# Patient Record
Sex: Female | Born: 1955 | Race: Black or African American | Hispanic: No | Marital: Married | Smoking: Current every day smoker
Health system: Southern US, Community
[De-identification: ages and names within clinical notes are randomized; demographics above are authoritative.]

## PROBLEM LIST (undated history)

## (undated) ENCOUNTER — Emergency Department (HOSPITAL_COMMUNITY): Payer: Medicare HMO | Source: Home / Self Care

## (undated) DIAGNOSIS — M1990 Unspecified osteoarthritis, unspecified site: Secondary | ICD-10-CM

## (undated) DIAGNOSIS — I255 Ischemic cardiomyopathy: Secondary | ICD-10-CM

## (undated) DIAGNOSIS — I10 Essential (primary) hypertension: Secondary | ICD-10-CM

## (undated) DIAGNOSIS — I2510 Atherosclerotic heart disease of native coronary artery without angina pectoris: Secondary | ICD-10-CM

## (undated) HISTORY — PX: CHOLECYSTECTOMY: SHX55

## (undated) HISTORY — PX: CARDIAC CATHETERIZATION: SHX172

## (undated) HISTORY — PX: KNEE SURGERY: SHX244

---

## 2005-06-10 ENCOUNTER — Ambulatory Visit (HOSPITAL_COMMUNITY): Admission: RE | Admit: 2005-06-10 | Discharge: 2005-06-10 | Payer: Self-pay | Admitting: Family Medicine

## 2005-06-17 ENCOUNTER — Ambulatory Visit: Payer: Self-pay | Admitting: Orthopedic Surgery

## 2006-05-28 ENCOUNTER — Emergency Department (HOSPITAL_COMMUNITY): Admission: EM | Admit: 2006-05-28 | Discharge: 2006-05-29 | Payer: Self-pay | Admitting: Emergency Medicine

## 2006-11-14 ENCOUNTER — Ambulatory Visit (HOSPITAL_COMMUNITY): Admission: RE | Admit: 2006-11-14 | Discharge: 2006-11-14 | Payer: Self-pay | Admitting: Family Medicine

## 2006-11-25 ENCOUNTER — Encounter: Admission: RE | Admit: 2006-11-25 | Discharge: 2006-11-25 | Payer: Self-pay | Admitting: Family Medicine

## 2007-07-10 ENCOUNTER — Encounter: Admission: RE | Admit: 2007-07-10 | Discharge: 2007-07-10 | Payer: Self-pay | Admitting: Family Medicine

## 2010-03-28 ENCOUNTER — Ambulatory Visit (HOSPITAL_COMMUNITY): Admission: RE | Admit: 2010-03-28 | Discharge: 2010-03-28 | Payer: Self-pay | Admitting: Family Medicine

## 2011-01-20 ENCOUNTER — Encounter: Payer: Self-pay | Admitting: Family Medicine

## 2011-01-21 ENCOUNTER — Inpatient Hospital Stay (HOSPITAL_COMMUNITY)
Admission: RE | Admit: 2011-01-21 | Discharge: 2011-01-24 | Payer: Self-pay | Source: Home / Self Care | Attending: Orthopedic Surgery | Admitting: Orthopedic Surgery

## 2011-01-21 LAB — DIFFERENTIAL
Basophils Absolute: 0 10*3/uL (ref 0.0–0.1)
Eosinophils Absolute: 0.1 10*3/uL (ref 0.0–0.7)
Eosinophils Relative: 2 % (ref 0–5)
Lymphs Abs: 3.3 10*3/uL (ref 0.7–4.0)
Neutrophils Relative %: 32 % — ABNORMAL LOW (ref 43–77)

## 2011-01-21 LAB — PROTIME-INR
INR: 0.92 (ref 0.00–1.49)
Prothrombin Time: 12.6 seconds (ref 11.6–15.2)

## 2011-01-21 LAB — URINALYSIS, ROUTINE W REFLEX MICROSCOPIC
Ketones, ur: NEGATIVE mg/dL
Nitrite: NEGATIVE
Protein, ur: NEGATIVE mg/dL
Urine Glucose, Fasting: NEGATIVE mg/dL
pH: 5 (ref 5.0–8.0)

## 2011-01-21 LAB — BASIC METABOLIC PANEL
BUN: 14 mg/dL (ref 6–23)
CO2: 28 mEq/L (ref 19–32)
Chloride: 104 mEq/L (ref 96–112)
Creatinine, Ser: 0.89 mg/dL (ref 0.4–1.2)
Potassium: 4.2 mEq/L (ref 3.5–5.1)

## 2011-01-21 LAB — CBC
MCV: 86.1 fL (ref 78.0–100.0)
Platelets: 246 10*3/uL (ref 150–400)
RBC: 4.54 MIL/uL (ref 3.87–5.11)
RDW: 14.3 % (ref 11.5–15.5)
WBC: 5.4 10*3/uL (ref 4.0–10.5)

## 2011-01-21 LAB — APTT: aPTT: 31 seconds (ref 24–37)

## 2011-01-21 LAB — SURGICAL PCR SCREEN: Staphylococcus aureus: NEGATIVE

## 2011-01-23 LAB — CBC
HCT: 28.6 % — ABNORMAL LOW (ref 36.0–46.0)
Hemoglobin: 10 g/dL — ABNORMAL LOW (ref 12.0–15.0)
Hemoglobin: 9.4 g/dL — ABNORMAL LOW (ref 12.0–15.0)
MCV: 84.8 fL (ref 78.0–100.0)
Platelets: 223 10*3/uL (ref 150–400)
RBC: 3.56 MIL/uL — ABNORMAL LOW (ref 3.87–5.11)
RBC: 3.34 MIL/uL — ABNORMAL LOW (ref 3.87–5.11)
WBC: 4.3 10*3/uL (ref 4.0–10.5)

## 2011-01-23 LAB — BASIC METABOLIC PANEL
BUN: 7 mg/dL (ref 6–23)
CO2: 27 mEq/L (ref 19–32)
Chloride: 101 mEq/L (ref 96–112)
GFR calc Af Amer: >60 mL/min (ref >60)
Potassium: 3.5 mEq/L (ref 3.5–5.1)

## 2011-01-23 LAB — PROTIME-INR
INR: 1.01 (ref 0.00–1.49)
INR: 1.3 (ref 0.00–1.49)
Prothrombin Time: 13.5 seconds (ref 11.6–15.2)
Prothrombin Time: 16.4 seconds — ABNORMAL HIGH (ref 11.6–15.2)

## 2011-01-24 LAB — TYPE AND SCREEN
ABO/RH(D): O POS
Unit division: 0

## 2011-01-24 LAB — CBC
HCT: 28.3 % — ABNORMAL LOW (ref 36.0–46.0)
Hemoglobin: 9.2 g/dL — ABNORMAL LOW (ref 12.0–15.0)
RBC: 3.31 MIL/uL — ABNORMAL LOW (ref 3.87–5.11)

## 2011-01-24 LAB — PROTIME-INR
INR: 1.41 (ref 0.00–1.49)
Prothrombin Time: 17.5 seconds — ABNORMAL HIGH (ref 11.6–15.2)

## 2011-02-15 NOTE — Discharge Summary (Signed)
[name]                 ACCOUNT NO.:  [id number]  MEDICAL RECORD NO.:  [id number]          PATIENT TYPE:  INP  LOCATION:  5015                         FACILITY:  [facility]  PHYSICIAN:  Feliberto Gottron. Turner Daniels, M.D.   DATE OF BIRTH:  [birth year]  DATE OF ADMISSION:  01/21/2011 DATE OF DISCHARGE:  01/24/2011                              DISCHARGE SUMMARY   CHIEF COMPLAINT:  Right knee pain.  HISTORY OF PRESENT ILLNESS:  This is a 55 year old lady who complains of severe unremitting pain in her right knee despite extensive conservative treatment.  She now desires a surgical intervention.  All risks and benefits of surgery were discussed with the patient.  Her past medical history is significant for hypertension.  PAST SURGICAL HISTORY:  Significant for lumpectomy and cholecystectomy.  She has no known drug allergies.  SOCIAL HISTORY:  She smokes half a pack of cigarettes per day and denies use of alcohol.  FAMILY HISTORY:  Positive for diabetes, hypertension, and coronary artery disease.  REVIEW OF SYSTEMS:  Positive for sickle cell trait.  PHYSICAL EXAMINATION:  Gross examination of the right knee demonstrates a significant valgus deformity.  Range of motion is 0 to 120 degrees. She is tender to palpation along the lateral joint line and is neurovascularly intact.  X-rays demonstrate bone-on-bone degenerative joint disease in the lateral compartment of the right knee.  PREOPERATIVE LABORATORY DATA:  White blood cell is 5.4, red blood cell is 4.54, hemoglobin 13, hematocrit 39.1, platelets 246.  PT 12.6, INR 0.94, PTT 31.  Sodium 140, potassium 4.2, chloride 104, glucose 87, BUN 14, creatinine 0.89.  Urinalysis was within normal limits.  HOSPITAL COURSE:  Ms. Rick was admitted to [facility] on January 21, 2011, when she underwent right total knee arthroplasty.  The procedure was performed by Dr. Gean Birchwood, and the patient tolerated it well. Two Hemovac drains were  placed into the right knee, a perioperative Foley catheter was also placed.  She was transferred to the floor on Lovenox and Coumadin for DVT prophylaxis.  On the first postoperative day, she was awake and alert.  She denied any nausea or vomiting, reported that her knee pain was well controlled.  Hemoglobin was 10. Her drain was removed without difficulty.  Surgical dressing remained clean.  On the second postoperative day, she complained of itching with her pain medicine that improved with Benadryl.  Her surgical dressing was changed and her incision was found to be benign.  Hemoglobin was 9.4.  She denied any dizziness or shortness of breath and was making great progress with physical therapy.  On the third postoperative day, she reported improvement in the itching, hemoglobin was 9.2.  Surgical dressing remained clean.  She met all of her physical therapy goals and was discharged home.  DISPOSITION:  The patient was discharged home on January 24, 2011.  She was weightbearing as tolerated.  Home health care would manage herwound, Coumadin, and physical therapy.  She will remain on Coumadin for 2 weeks with a target INR of 1.5-2.  Her followup in the clinic will be in 10 days for x-rays  and staple removal.  FINAL DIAGNOSIS:  End-stage degenerative joint disease of the right knee.     Shirl Harris, PA   ______________________________ Feliberto Gottron. Turner Daniels, M.D.    JW/MEDQ  D:  02/07/2011  T:  02/08/2011  Job:  [id number]  Electronically Signed by Shirl Harris PA on 02/14/2011 09:44:15 AM Electronically Signed by Gean Birchwood M.D. on 02/15/2011 12:25:26 AM

## 2012-02-11 IMAGING — CR DG CHEST 2V
2 series · 2 of 2 positions shown · non-contrast
Comparison: None.

CLINICAL DATA: Preop for surgery on the right knee

CHEST - 2 VIEW

[view not recorded (1 of 2)]
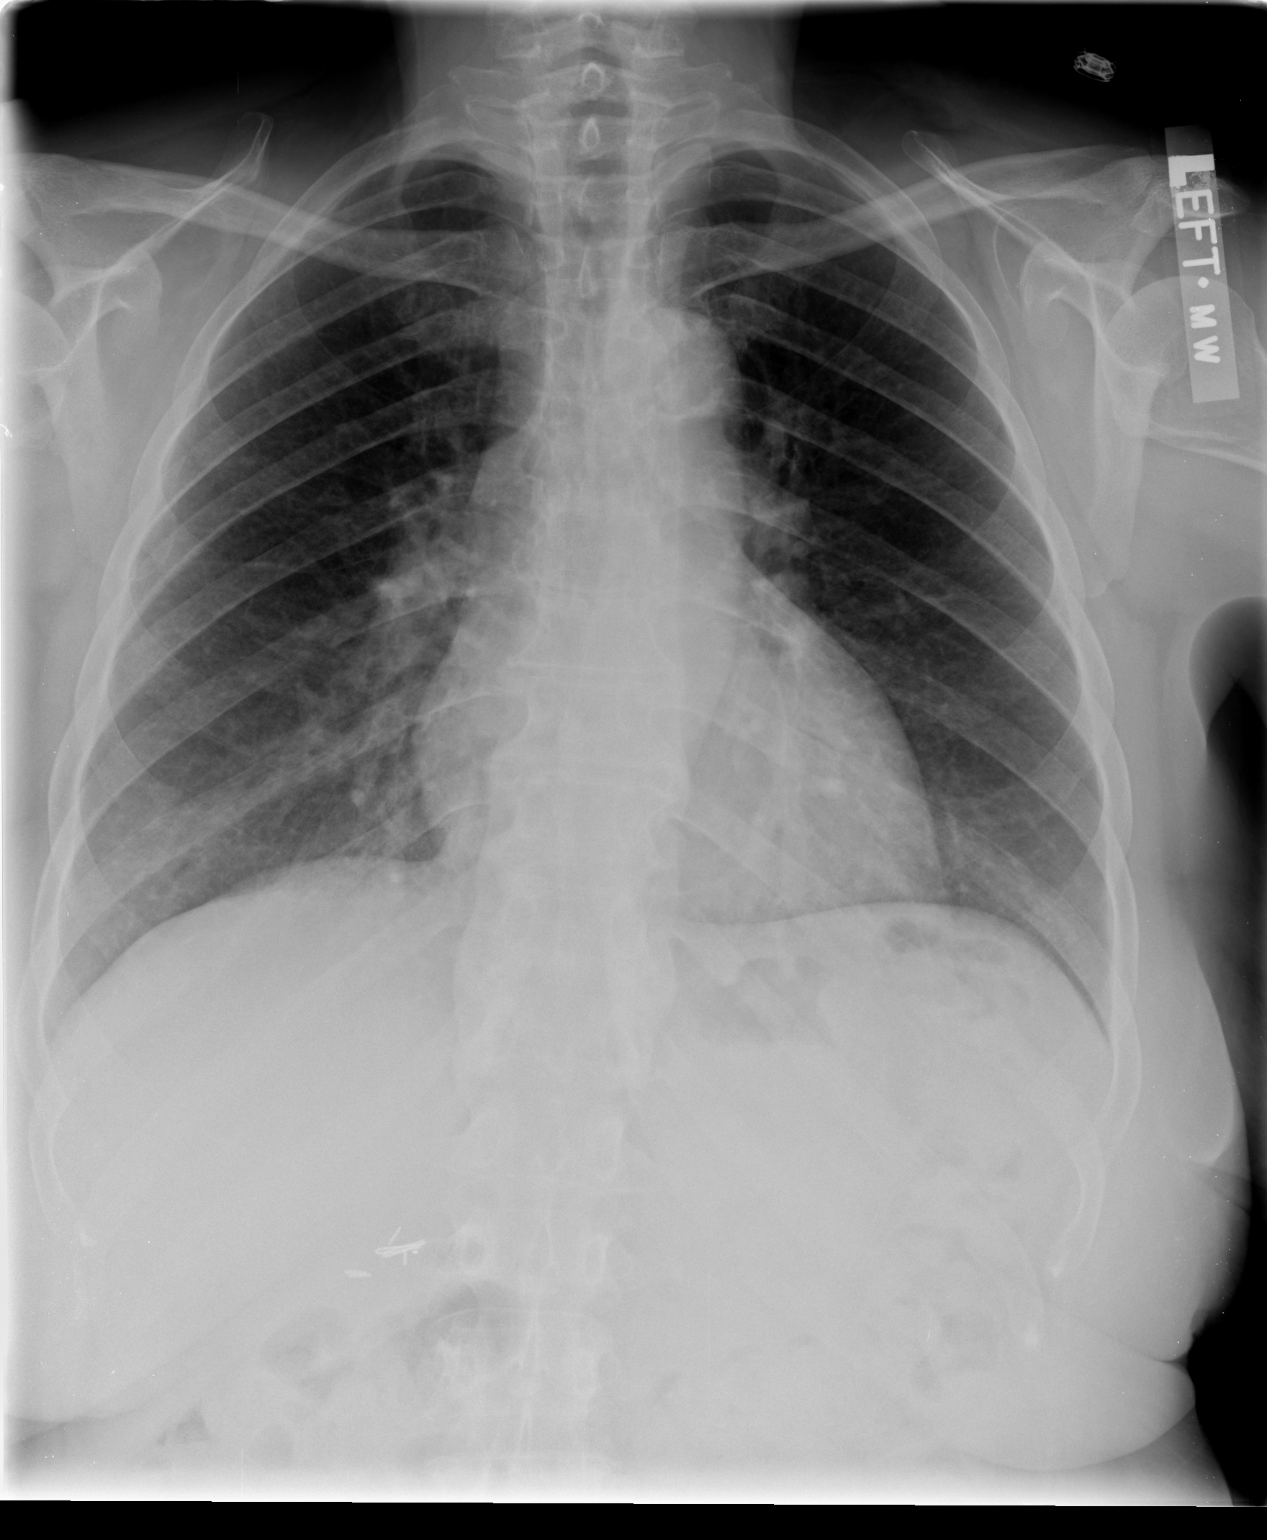

[view not recorded (2 of 2)]
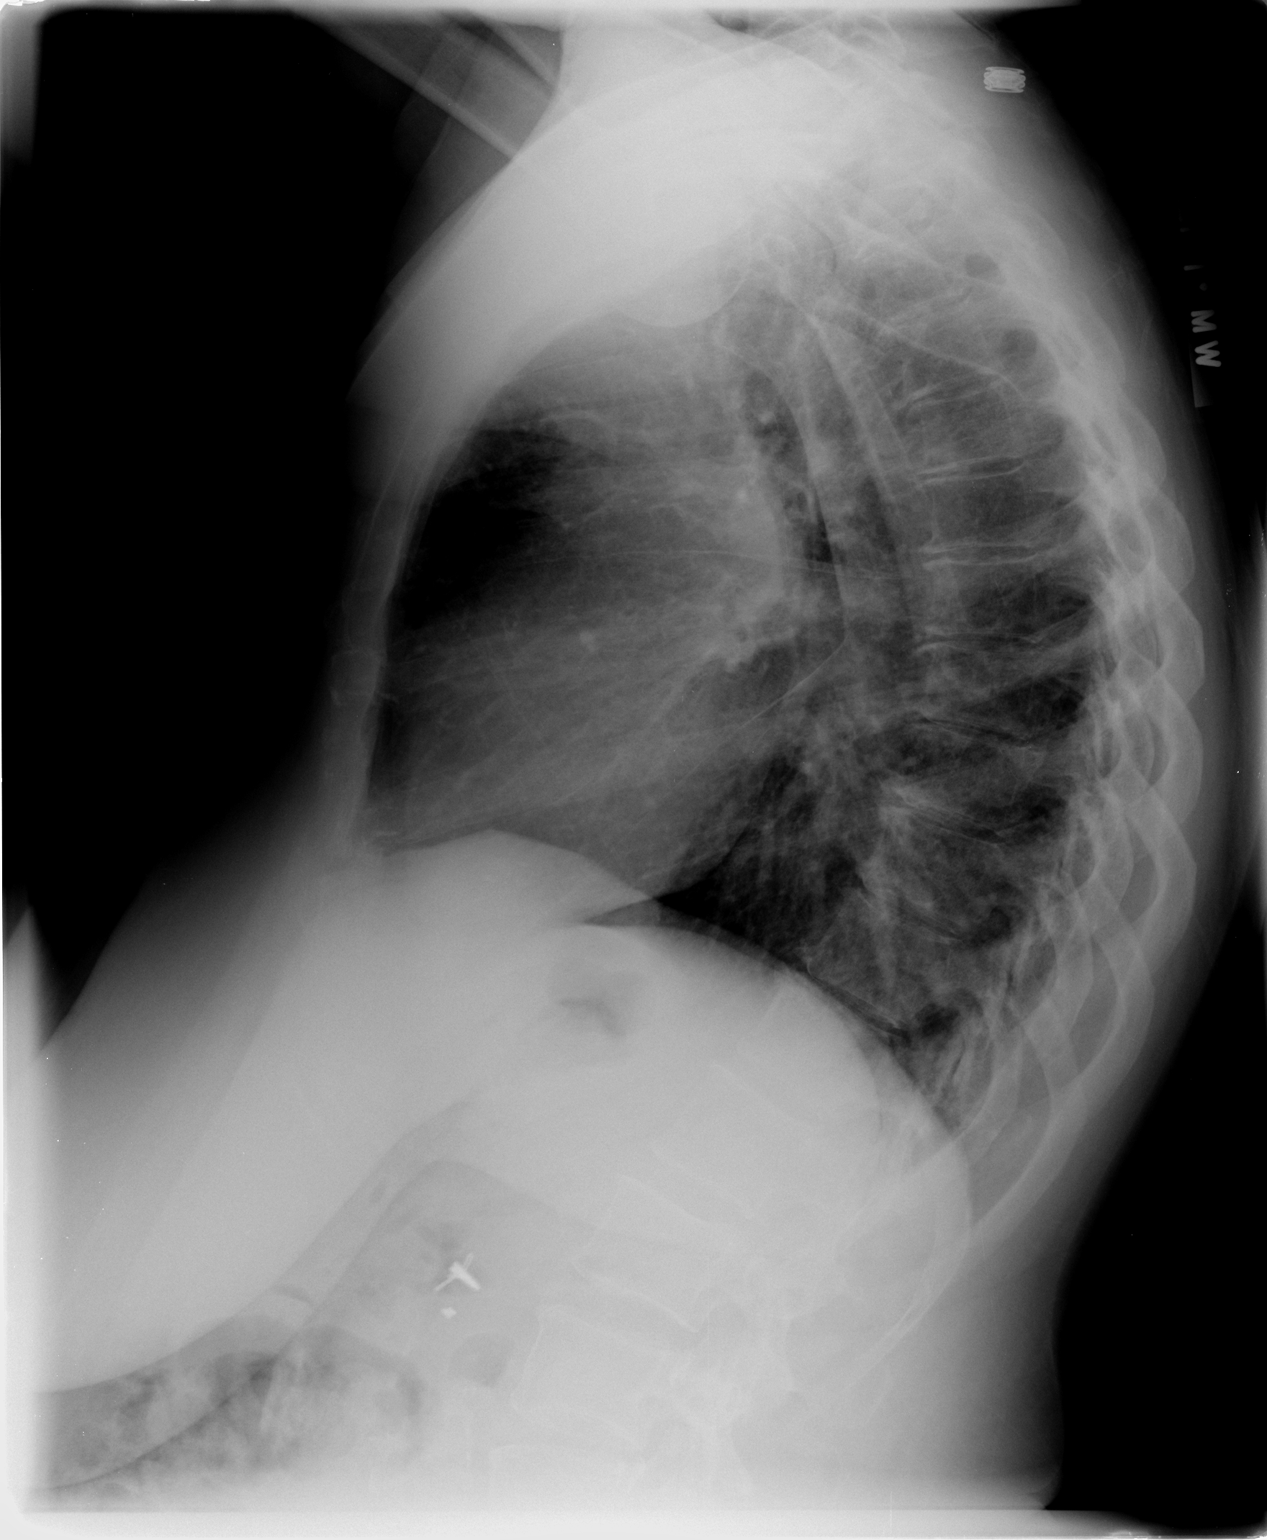

[2 of 2 positions shown; findings below may reference images not displayed]

FINDINGS: The lungs are clear.  Mediastinal contours appear normal.
The heart is within upper limits of normal.  There are degenerative
changes throughout the thoracic spine.  Surgical clips are noted in
the right upper quadrant from prior cholecystectomy.
IMPRESSION: No active lung disease.  Borderline cardiomegaly.

## 2012-06-29 DIAGNOSIS — I2510 Atherosclerotic heart disease of native coronary artery without angina pectoris: Secondary | ICD-10-CM

## 2012-06-29 DIAGNOSIS — I255 Ischemic cardiomyopathy: Secondary | ICD-10-CM

## 2012-06-29 HISTORY — DX: Ischemic cardiomyopathy: I25.5

## 2012-06-29 HISTORY — DX: Atherosclerotic heart disease of native coronary artery without angina pectoris: I25.10

## 2012-07-12 ENCOUNTER — Inpatient Hospital Stay (HOSPITAL_COMMUNITY)
Admission: EM | Admit: 2012-07-12 | Discharge: 2012-07-15 | DRG: 853 | Disposition: A | Discharge status: Home / Self Care | Payer: BC Managed Care – PPO | Attending: Cardiology | Admitting: Cardiology

## 2012-07-12 ENCOUNTER — Encounter (HOSPITAL_COMMUNITY): Payer: Self-pay | Admitting: Emergency Medicine

## 2012-07-12 ENCOUNTER — Emergency Department (HOSPITAL_COMMUNITY): Payer: BC Managed Care – PPO

## 2012-07-12 DIAGNOSIS — E669 Obesity, unspecified: Secondary | ICD-10-CM | POA: Diagnosis present

## 2012-07-12 DIAGNOSIS — Z955 Presence of coronary angioplasty implant and graft: Secondary | ICD-10-CM

## 2012-07-12 DIAGNOSIS — F17200 Nicotine dependence, unspecified, uncomplicated: Secondary | ICD-10-CM | POA: Diagnosis present

## 2012-07-12 DIAGNOSIS — R079 Chest pain, unspecified: Secondary | ICD-10-CM

## 2012-07-12 DIAGNOSIS — Z720 Tobacco use: Secondary | ICD-10-CM | POA: Diagnosis present

## 2012-07-12 DIAGNOSIS — Z8249 Family history of ischemic heart disease and other diseases of the circulatory system: Secondary | ICD-10-CM

## 2012-07-12 DIAGNOSIS — I10 Essential (primary) hypertension: Secondary | ICD-10-CM | POA: Diagnosis present

## 2012-07-12 DIAGNOSIS — I255 Ischemic cardiomyopathy: Secondary | ICD-10-CM | POA: Diagnosis present

## 2012-07-12 DIAGNOSIS — I2589 Other forms of chronic ischemic heart disease: Secondary | ICD-10-CM | POA: Diagnosis present

## 2012-07-12 DIAGNOSIS — Z96659 Presence of unspecified artificial knee joint: Secondary | ICD-10-CM

## 2012-07-12 DIAGNOSIS — I214 Non-ST elevation (NSTEMI) myocardial infarction: Principal | ICD-10-CM | POA: Diagnosis present

## 2012-07-12 HISTORY — DX: Essential (primary) hypertension: I10

## 2012-07-12 LAB — CBC
HCT: 41 % (ref 36.0–46.0)
Hemoglobin: 13.4 g/dL (ref 12.0–15.0)
MCHC: 32.7 g/dL (ref 30.0–36.0)
RBC: 4.72 MIL/uL (ref 3.87–5.11)
WBC: 4.2 10*3/uL (ref 4.0–10.5)

## 2012-07-12 LAB — COMPREHENSIVE METABOLIC PANEL
ALT: 15 U/L (ref 0–35)
Alkaline Phosphatase: 63 U/L (ref 39–117)
BUN: 13 mg/dL (ref 6–23)
CO2: 30 mEq/L (ref 19–32)
Chloride: 103 mEq/L (ref 96–112)
GFR calc Af Amer: 71 mL/min — ABNORMAL LOW (ref >90)
GFR calc non Af Amer: 62 mL/min — ABNORMAL LOW (ref >90)
Glucose, Bld: 116 mg/dL — ABNORMAL HIGH (ref 70–99)
Potassium: 3.4 mEq/L — ABNORMAL LOW (ref 3.5–5.1)
Total Bilirubin: 0.2 mg/dL — ABNORMAL LOW (ref 0.3–1.2)

## 2012-07-12 LAB — PROTIME-INR: Prothrombin Time: 12.5 seconds (ref 11.6–15.2)

## 2012-07-12 MED ORDER — ASPIRIN 81 MG PO CHEW
324.0000 mg | CHEWABLE_TABLET | Freq: Once | ORAL | Status: AC
  Administered 2012-07-12: 324 mg via ORAL
  Filled 2012-07-12: qty 4

## 2012-07-12 MED ORDER — SODIUM CHLORIDE 0.9 % IV SOLN
1000.0000 mL | INTRAVENOUS | Status: DC
  Administered 2012-07-12: 1000 mL via INTRAVENOUS

## 2012-07-12 MED ORDER — GI COCKTAIL ~~LOC~~
30.0000 mL | Freq: Once | ORAL | Status: AC
  Administered 2012-07-12: 30 mL via ORAL
  Filled 2012-07-12: qty 30

## 2012-07-12 MED ORDER — MORPHINE SULFATE 4 MG/ML IJ SOLN
4.0000 mg | Freq: Once | INTRAMUSCULAR | Status: AC
  Administered 2012-07-12: 4 mg via INTRAVENOUS
  Filled 2012-07-12: qty 1

## 2012-07-12 MED ORDER — NITROGLYCERIN 2 % TD OINT
1.0000 [in_us] | TOPICAL_OINTMENT | Freq: Four times a day (QID) | TRANSDERMAL | Status: DC
  Administered 2012-07-12: 1 [in_us] via TOPICAL
  Filled 2012-07-12: qty 1

## 2012-07-12 MED ORDER — HEPARIN BOLUS VIA INFUSION
4000.0000 [IU] | Freq: Once | INTRAVENOUS | Status: AC
  Administered 2012-07-12: 4000 [IU] via INTRAVENOUS

## 2012-07-12 MED ORDER — HEPARIN (PORCINE) IN NACL 100-0.45 UNIT/ML-% IJ SOLN
12.0000 [IU]/kg/h | INTRAMUSCULAR | Status: DC
  Administered 2012-07-12: 12 [IU]/kg/h via INTRAVENOUS
  Filled 2012-07-12 (×2): qty 250

## 2012-07-12 NOTE — ED Provider Notes (Signed)
History    CSN: [id number] Arrival date & time 07/12/12  2036 First MD Initiated Contact with Patient 07/12/12 2041     Chief Complaint  Patient presents with  . Chest Pain   Patient is a 56 y.o. female presenting with chest pain. The history is provided by the patient.  Chest Pain The chest pain began yesterday. Chest pain occurs frequently. The chest pain is unchanged. Associated with: Nothing seems to bring it on. The severity of the pain is moderate. The quality of the pain is described as sharp. The pain does not radiate. Exacerbated by: Nothing, sitting up helps sometimes.   Primary symptoms include shortness of breath and nausea. Pertinent negatives for primary symptoms include no fever, no cough, no wheezing, no vomiting and no dizziness.  Pertinent negatives for associated symptoms include no near-syncope and no weakness. She tried antacids (with some improvement last night, but she noticed it again this morning) for the symptoms. Risk factors include smoking/tobacco exposure.  Pertinent negatives for past medical history include no MI and no PE.  Her family medical history is significant for CAD in family (brother, in his 17s).    Past Medical History  Diagnosis Date  . Hypertension    Past Surgical History  Procedure Date  . Knee surgery    FMHX: Brother with CAD  History  Substance Use Topics  . Smoking status: Current Everyday Smoker  . Smokeless tobacco: Not on file  . Alcohol Use: No    OB History    Grav Para Term Preterm Abortions TAB SAB Ect Mult Living                  Review of Systems  Constitutional: Negative for fever.  Respiratory: Positive for shortness of breath. Negative for cough and wheezing.   Cardiovascular: Positive for chest pain. Negative for near-syncope.  Gastrointestinal: Positive for nausea. Negative for vomiting.  Neurological: Negative for dizziness and weakness.  All other systems reviewed and are negative.    Allergies    Review of patient's allergies indicates no known allergies.  Home Medications   Current Outpatient Rx  Name Route Sig Dispense Refill  . BENAZEPRIL-HYDROCHLOROTHIAZIDE 20-25 MG PO TABS Oral Take 1 tablet by mouth daily.    Marland Kitchen HYDROCODONE-ACETAMINOPHEN 7.5-325 MG PO TABS Oral Take 1 tablet by mouth every 6 (six) hours as needed.      BP 175/105  Pulse 84  Temp 97.5 F (36.4 C) (Oral)  Resp 20  Ht 5\' 7"  (1.702 m)  Wt 240 lb (108.863 kg)  BMI 37.59 kg/m2  SpO2 99%  Physical Exam  Nursing note and vitals reviewed. Constitutional: She appears well-developed and well-nourished. No distress.  HENT:  Head: Normocephalic and atraumatic.  Right Ear: External ear normal.  Left Ear: External ear normal.  Eyes: Conjunctivae are normal. Right eye exhibits no discharge. Left eye exhibits no discharge. No scleral icterus.  Neck: Neck supple. No tracheal deviation present.  Cardiovascular: Normal rate, regular rhythm and intact distal pulses.   Pulmonary/Chest: Effort normal and breath sounds normal. No stridor. No respiratory distress. She has no wheezes. She has no rales. She exhibits tenderness (mild).  Abdominal: Soft. Bowel sounds are normal. She exhibits no distension and no mass. There is no tenderness. There is no rigidity, no rebound, no guarding and negative Murphy's sign. No hernia.  Musculoskeletal: She exhibits no edema and no tenderness.  Neurological: She is alert. She has normal strength. No sensory deficit. Cranial nerve deficit:  no gross defecits noted. She exhibits normal muscle tone. She displays no seizure activity. Coordination normal.  Skin: Skin is warm and dry. No rash noted.  Psychiatric: She has a normal mood and affect.    ED Course  Procedures (including critical care time)  Rate: 74  Rhythm: normal sinus rhythm  QRS Axis: normal  Intervals: normal  ST/T Wave abnormalities: non specific st t changes anterior and lateral   Conduction Disutrbances:none   Narrative Interpretation: abnl  Old EKG Reviewed: st t changes are new compared to previous  Labs Reviewed  POCT I-STAT TROPONIN I - Abnormal; Notable for the following:    Troponin i, poc 0.56 (*)     All other components within normal limits  CBC  PROTIME-INR  APTT  COMPREHENSIVE METABOLIC PANEL   Dg Chest Portable 1 View  07/12/2012  *RADIOLOGY REPORT*  Clinical Data: Chest pain, shortness of breath  PORTABLE CHEST - 1 VIEW  Comparison: 01/16/2011  Findings: Mild enlargement cardiomediastinal silhouette is noted. Fine detail is obscured by patient body habitus.  The lungs are clear.  No pleural effusion.  No acute osseous abnormality.  IMPRESSION: Mild cardiomegaly without focal acute finding.  Original Report Authenticated By: Harrel Lemon, M.D.     1. Chest pain   2. Elevated troponin       MDM  Pt's chest pain with some typical and atypical features for CAD.  However she does have non specific EKG changes associated with elevated troponin.  Pt still having some slight discomfort.  Will start heparin, NTG, morphine. .  Given asa.  Will consult cardiology at [facility] for transfer.   Case discussed with Dr Rennis Golden.  Pt will be transferred to [facility].  Would like the hospitalist service to admit and they will consult.      Celene Kras, MD 07/12/12 2229

## 2012-07-12 NOTE — ED Provider Notes (Signed)
23:10 Dr. Orvan Falconer states he has spoken to Dr.S  Rito Ehrlich and Dr Houston Siren and they do not admit non-STEMI for [facility] heart and vascular. He states the cardiologist will have to admit this patient. He also states the patient is a patient of Dr. Renard Matter.  23:36 Dr Rennis Golden states he is going to talk to Dr Chancy Milroy and will call me back, states patient will need to come to [facility] for admission.   00:10 Dr Orvan Falconer in the ED and is seeing patient.   00:25 Dr Orvan Falconer states he is going to do the H & P and orders for the transfer   Devoria Albe, MD, Franz Dell, MD 07/13/12 [phone]

## 2012-07-12 NOTE — ED Notes (Signed)
Patient complaining of sharp central chest pain that started yesterday evening. Also complaining of slight nausea and shortness of breath. patient reports she originally thought this was indigestion.

## 2012-07-12 NOTE — Progress Notes (Signed)
ANTICOAGULATION CONSULT NOTE - Initial Consult    Pharmacy Consult for Heparin    Indication: ACS \\STEMI   No Known Allergies  Patient Measurements: Height: 5\' 7"  (170.2 cm) Weight: 240 lb (108.863 kg) IBW/kg (Calculated) : 61.6  Heparin Dosing Weight  86 kg  Vital Signs: Temp: 97.5 F (36.4 C) (07/14 2045) Temp src: Oral (07/14 2045) BP: 175/105 mmHg (07/14 2045) Pulse Rate: 84  (07/14 2045)  Labs:  Basename 07/12/12 2125  HGB 13.4  HCT 41.0  PLT 242  APTT 32  LABPROT 12.5  INR 0.91  HEPARINUNFRC --  CREATININE --  CKTOTAL --  CKMB --  TROPONINI --    Estimated Creatinine Clearance: 103.4 ml/min (by C-G formula based on Cr of 0.77).   Medical History: Past Medical History  Diagnosis Date  . Hypertension     Assessment: OK for protocol Obese patient  Goal of Therapy:  Heparin level 0.3 - 0.7 Monitor platelets   Plan:  Heparin 4000 unit bolus, then Heparin 1050 Units/hr  Heparin level in 6 hours and daily Monitor platelets CBC daily  Raquel James, Gussie Towson Bennett 07/12/2012,9:49 PM

## 2012-07-13 ENCOUNTER — Encounter (HOSPITAL_COMMUNITY): Payer: Self-pay | Admitting: Internal Medicine

## 2012-07-13 ENCOUNTER — Encounter (HOSPITAL_COMMUNITY)
Admission: EM | Disposition: A | Discharge status: Home / Self Care | Payer: Self-pay | Source: Home / Self Care | Attending: Cardiology

## 2012-07-13 DIAGNOSIS — I214 Non-ST elevation (NSTEMI) myocardial infarction: Secondary | ICD-10-CM

## 2012-07-13 DIAGNOSIS — F17200 Nicotine dependence, unspecified, uncomplicated: Secondary | ICD-10-CM

## 2012-07-13 DIAGNOSIS — Z955 Presence of coronary angioplasty implant and graft: Secondary | ICD-10-CM

## 2012-07-13 DIAGNOSIS — I10 Essential (primary) hypertension: Secondary | ICD-10-CM

## 2012-07-13 DIAGNOSIS — Z720 Tobacco use: Secondary | ICD-10-CM | POA: Diagnosis present

## 2012-07-13 HISTORY — PX: LEFT HEART CATHETERIZATION WITH CORONARY ANGIOGRAM: SHX5451

## 2012-07-13 HISTORY — PX: PERCUTANEOUS CORONARY STENT INTERVENTION (PCI-S): SHX5485

## 2012-07-13 LAB — LIPID PANEL
HDL: 68 mg/dL (ref >39)
LDL Cholesterol: 88 mg/dL (ref 0–99)
Total CHOL/HDL Ratio: 2.4 RATIO
Triglycerides: 43 mg/dL (ref <150)

## 2012-07-13 LAB — CBC
HCT: 36.3 % (ref 36.0–46.0)
MCHC: 32.8 g/dL (ref 30.0–36.0)
MCV: 86.6 fL (ref 78.0–100.0)
Platelets: 243 10*3/uL (ref 150–400)
RDW: 14.2 % (ref 11.5–15.5)
WBC: 5 10*3/uL (ref 4.0–10.5)

## 2012-07-13 LAB — CARDIAC PANEL(CRET KIN+CKTOT+MB+TROPI)
CK, MB: 26.3 ng/mL (ref 0.3–4.0)
CK, MB: 39.3 ng/mL (ref 0.3–4.0)
Relative Index: 7.1 — ABNORMAL HIGH (ref 0.0–2.5)
Troponin I: 2 ng/mL (ref <0.30)
Troponin I: 2.79 ng/mL (ref <0.30)

## 2012-07-13 LAB — BASIC METABOLIC PANEL
BUN: 11 mg/dL (ref 6–23)
Chloride: 104 mEq/L (ref 96–112)
Creatinine, Ser: 0.77 mg/dL (ref 0.50–1.10)
GFR calc Af Amer: >90 mL/min (ref >90)
GFR calc non Af Amer: >90 mL/min (ref >90)

## 2012-07-13 LAB — HEMOGLOBIN A1C
Hgb A1c MFr Bld: 6.1 % — ABNORMAL HIGH (ref <5.7)
Mean Plasma Glucose: 128 mg/dL — ABNORMAL HIGH (ref <117)

## 2012-07-13 LAB — POCT I-STAT TROPONIN I: Troponin i, poc: 0.64 ng/mL (ref 0.00–0.08)

## 2012-07-13 LAB — POCT ACTIVATED CLOTTING TIME: Activated Clotting Time: 564 seconds

## 2012-07-13 LAB — PROTIME-INR: Prothrombin Time: 13.8 seconds (ref 11.6–15.2)

## 2012-07-13 SURGERY — LEFT HEART CATHETERIZATION WITH CORONARY ANGIOGRAM
Anesthesia: LOCAL

## 2012-07-13 MED ORDER — ASPIRIN 300 MG RE SUPP
300.0000 mg | RECTAL | Status: DC
  Filled 2012-07-13: qty 1

## 2012-07-13 MED ORDER — PRASUGREL HCL 10 MG PO TABS
10.0000 mg | ORAL_TABLET | Freq: Every day | ORAL | Status: DC
  Administered 2012-07-14 – 2012-07-15 (×2): 10 mg via ORAL
  Filled 2012-07-13 (×2): qty 1

## 2012-07-13 MED ORDER — SODIUM CHLORIDE 0.9 % IV SOLN: 250.0000 mL | INTRAVENOUS | Status: DC

## 2012-07-13 MED ORDER — BIVALIRUDIN 250 MG IV SOLR
INTRAVENOUS | Status: AC
  Filled 2012-07-13: qty 250

## 2012-07-13 MED ORDER — ASPIRIN 81 MG PO CHEW
81.0000 mg | CHEWABLE_TABLET | Freq: Every day | ORAL | Status: DC
  Administered 2012-07-13 – 2012-07-15 (×3): 81 mg via ORAL
  Filled 2012-07-13 (×3): qty 1

## 2012-07-13 MED ORDER — ASPIRIN EC 81 MG PO TBEC: 81.0000 mg | DELAYED_RELEASE_TABLET | Freq: Every day | ORAL | Status: DC

## 2012-07-13 MED ORDER — ATORVASTATIN CALCIUM 40 MG PO TABS
40.0000 mg | ORAL_TABLET | Freq: Every day | ORAL | Status: DC
  Filled 2012-07-13 (×2): qty 1

## 2012-07-13 MED ORDER — POTASSIUM CHLORIDE IN NACL 20-0.9 MEQ/L-% IV SOLN
INTRAVENOUS | Status: DC
  Administered 2012-07-13: 04:00:00 via INTRAVENOUS
  Filled 2012-07-13 (×3): qty 1000

## 2012-07-13 MED ORDER — DIAZEPAM 5 MG PO TABS
5.0000 mg | ORAL_TABLET | Freq: Every day | ORAL | Status: DC
  Administered 2012-07-13 – 2012-07-14 (×2): 5 mg via ORAL
  Filled 2012-07-13 (×2): qty 1

## 2012-07-13 MED ORDER — ASPIRIN 81 MG PO CHEW: 324.0000 mg | CHEWABLE_TABLET | ORAL | Status: DC

## 2012-07-13 MED ORDER — PANTOPRAZOLE SODIUM 40 MG PO TBEC
40.0000 mg | DELAYED_RELEASE_TABLET | Freq: Every day | ORAL | Status: DC
  Administered 2012-07-14 – 2012-07-15 (×2): 40 mg via ORAL
  Filled 2012-07-13 (×2): qty 1

## 2012-07-13 MED ORDER — SODIUM CHLORIDE 0.9 % IJ SOLN: 3.0000 mL | INTRAMUSCULAR | Status: DC | PRN

## 2012-07-13 MED ORDER — BIOTENE DRY MOUTH MT LIQD
15.0000 mL | Freq: Two times a day (BID) | OROMUCOSAL | Status: DC
  Administered 2012-07-13: 15 mL via OROMUCOSAL

## 2012-07-13 MED ORDER — NITROGLYCERIN IN D5W 200-5 MCG/ML-% IV SOLN
2.0000 ug/min | INTRAVENOUS | Status: DC
  Administered 2012-07-13: 15 ug/min via INTRAVENOUS
  Administered 2012-07-13: 5 ug/min via INTRAVENOUS
  Filled 2012-07-13: qty 250

## 2012-07-13 MED ORDER — METOPROLOL TARTRATE 1 MG/ML IV SOLN: 5.0000 mg | Freq: Once | INTRAVENOUS | Status: DC

## 2012-07-13 MED ORDER — ATORVASTATIN CALCIUM 10 MG PO TABS
10.0000 mg | ORAL_TABLET | Freq: Every day | ORAL | Status: DC
  Filled 2012-07-13: qty 1

## 2012-07-13 MED ORDER — SODIUM CHLORIDE 0.9 % IJ SOLN: 3.0000 mL | Freq: Two times a day (BID) | INTRAMUSCULAR | Status: DC

## 2012-07-13 MED ORDER — METOPROLOL TARTRATE 12.5 MG HALF TABLET
25.0000 mg | ORAL_TABLET | Freq: Two times a day (BID) | ORAL | Status: DC
Start: 2012-07-13 — End: 2012-07-14
  Administered 2012-07-13: 25 mg via ORAL
  Filled 2012-07-13 (×2): qty 2

## 2012-07-13 MED ORDER — SODIUM CHLORIDE 0.9 % IJ SOLN
3.0000 mL | Freq: Two times a day (BID) | INTRAMUSCULAR | Status: DC
  Administered 2012-07-13 – 2012-07-14 (×3): 3 mL via INTRAVENOUS

## 2012-07-13 MED ORDER — PRASUGREL HCL 10 MG PO TABS
ORAL_TABLET | ORAL | Status: AC
  Filled 2012-07-13: qty 6

## 2012-07-13 MED ORDER — ONDANSETRON HCL 4 MG/2ML IJ SOLN: 4.0000 mg | Freq: Four times a day (QID) | INTRAMUSCULAR | Status: DC | PRN

## 2012-07-13 MED ORDER — CLOPIDOGREL BISULFATE 300 MG PO TABS
300.0000 mg | ORAL_TABLET | Freq: Once | ORAL | Status: AC
  Administered 2012-07-13: 300 mg via ORAL
  Filled 2012-07-13: qty 1

## 2012-07-13 MED ORDER — SODIUM CHLORIDE 0.9 % IV SOLN: 1.0000 mL/kg/h | INTRAVENOUS | Status: AC

## 2012-07-13 MED ORDER — FENTANYL CITRATE 0.05 MG/ML IJ SOLN
INTRAMUSCULAR | Status: AC
  Filled 2012-07-13: qty 2

## 2012-07-13 MED ORDER — CLOPIDOGREL BISULFATE 75 MG PO TABS: 75.0000 mg | ORAL_TABLET | Freq: Every day | ORAL | Status: DC

## 2012-07-13 MED ORDER — METOPROLOL TARTRATE 12.5 MG HALF TABLET
12.5000 mg | ORAL_TABLET | Freq: Two times a day (BID) | ORAL | Status: DC
  Administered 2012-07-13: 12.5 mg via ORAL
  Filled 2012-07-13 (×2): qty 1

## 2012-07-13 MED ORDER — HEPARIN SODIUM (PORCINE) 1000 UNIT/ML IJ SOLN
INTRAMUSCULAR | Status: AC
  Filled 2012-07-13: qty 1

## 2012-07-13 MED ORDER — ACETAMINOPHEN 325 MG PO TABS
650.0000 mg | ORAL_TABLET | ORAL | Status: DC | PRN
  Administered 2012-07-13 – 2012-07-14 (×3): 650 mg via ORAL
  Filled 2012-07-13 (×3): qty 2

## 2012-07-13 MED ORDER — SODIUM CHLORIDE 0.9 % IV SOLN
0.2500 mg/kg/h | INTRAVENOUS | Status: DC
  Filled 2012-07-13: qty 250

## 2012-07-13 MED ORDER — ADENOSINE 6 MG/2ML IV SOLN
INTRAVENOUS | Status: AC
  Filled 2012-07-13: qty 2

## 2012-07-13 MED ORDER — ACETAMINOPHEN 325 MG PO TABS
650.0000 mg | ORAL_TABLET | ORAL | Status: DC | PRN
  Administered 2012-07-13 (×2): 650 mg via ORAL
  Filled 2012-07-13 (×2): qty 2

## 2012-07-13 MED ORDER — VERAPAMIL HCL 2.5 MG/ML IV SOLN
INTRAVENOUS | Status: AC
  Filled 2012-07-13: qty 2

## 2012-07-13 MED ORDER — MORPHINE SULFATE 2 MG/ML IJ SOLN: 2.0000 mg | INTRAMUSCULAR | Status: DC | PRN

## 2012-07-13 MED ORDER — MIDAZOLAM HCL 2 MG/2ML IJ SOLN
INTRAMUSCULAR | Status: AC
  Filled 2012-07-13: qty 2

## 2012-07-13 MED ORDER — SODIUM CHLORIDE 0.9 % IV SOLN: 250.0000 mL | INTRAVENOUS | Status: DC | PRN

## 2012-07-13 NOTE — ED Notes (Signed)
MD at bedside. (Dr. Orvan Falconer)

## 2012-07-13 NOTE — Brief Op Note (Signed)
07/12/2012 - 07/13/2012  1:25 PM  PATIENT:  [name]  56 y.o. female with a PMH of HTN and long term tobacco use with a significant FH of CAD (brother CAD in 67s) who presented to [facility] ER with ACS- troponin + c/w NSTEMI (Troponin 2.0) & significant Anterior ST-T changes on ECG c/w LAD ischemia.  Chest "pain free" overnight on NTG, but with residual pressure made worse with getting up to bathroom.  IV NTG & Heparin, ASA & Plavix overnight.  She is now referred for cardiac catheterization.  PRE-OPERATIVE DIAGNOSIS:  NSTEMI, Abnormal ECG with Anterior Ischemic changes, HTN, smoker.  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  Large Dominant RCA with a severe Shepherd's crook bend, mid ~40-50% lesion; bifurcates distally into a large extensive RPDA and large extensive R Posterior AV Groove branch that gives off the AV Nodal artery and 2 Moderate to large caliber RPLs.  LV Gram: EF ~40-45%, Mid-distal Anterior, Anterolateral and Apical Moderate Hypokinesis  Hemodynamics:  AoP: 138/85 mmHg; 105 mmHg mean (post PCI  LVP: Pre PCI 129/15 mmHg with EDP 27 mmHg -- Post PCI 136/17 mmHg, EDP 21 mmHg  Procedure(s) (LRB):  LEFT HEART CATHETERIZATION WITH CORONARY ANGIOGRAM (N/A)  COMPLEX MULTI-SITE PERCUTANEOUS CORONARY STENT INTERVENTION (PCI-S) of the Mid LAD 100% thrombotic bifurcation occlusion with 100% D1 occlusion along with a more distal mid LAD 70% hazy lesion at D2 using 2 overlapping Xience Expedition DES Stents:   2.75 mm x 28 mm distal (lesion 2), 3.0 mm x 18 mm proximal (Lesion 1) post dilated proximally to 3.35 mm, 3.30 mid and 2.8 mm distally  Rescue PTCA of the jailed D1 ostium using  a 2.62mm x 8 mm Emerge MR balloon.  Surgeon(s) and Role: Marykay Lex, MD - Primary  ANESTHESIA:   local and IV sedation Versed 1 mg, Fentanyl 100 mcg; Premed 5 mg Valium  EBL:  < 10 ml  EQUIPMENT: 5 Fr R radial for Diagnostic -> upsized to 6 Fr for PCI; LCA & RCA angiography - 5 Fr TIG 4.0; LV Gram - Angled Pigtail;  PCI - 6 Fr XB LAD 3.5, Wire BMW-LAD, PT2 - Diag for rescue PTCA   LOCAL MEDICATIONS USED:  LIDOCAINE 2 ml  TR BAND: 15 ML air, 1300 - non-occlusive hemostasis  DICTATION: .Note written in EPIC  PLAN OF CARE: Admit to inpatient  - Converted from Plavix to Effient  Standard Post Radial Cath Care  Increase BB dose to 25 mg bid, and restart ACE-I tomorrow.  Anticipate transfer to floor tomorrow, and d/c on 7/17.  PATIENT DISPOSITION:  PACU - guarded condition.   Delay start of Pharmacological VTE agent (>24hrs) due to surgical blood loss or risk of bleeding: not applicable

## 2012-07-13 NOTE — Progress Notes (Signed)
I saw the patient in the AM.  I agree with this brief summary note. She was indeed Chest Pain free, but continued to have chest pressure.  I agree with the initial plan of care.  Marykay Lex, M.D., M.S. [facility] [address] [place], [place]  [address]  [phone] Pager # [phone]  07/13/2012 7:41 PM

## 2012-07-13 NOTE — ED Notes (Signed)
Nitro paste removed from left anterior chest.

## 2012-07-13 NOTE — Progress Notes (Signed)
[facility] hospitalist progress note. Chief complaint. Transfer note. This 56 year old female was seen at [facility] emergency room. She presented with up to 10/10 intensity chest pain and associated nausea, dizziness, diaphoresis, and dyspnea. Although her EKG showed no acute change her troponin was noted elevated at 0.56. With administration of aspirin and nitroglycerin she achieved pain relief. The case was discussed with Dr. Rennis Golden of [facility] heart and vascular and it was agreed to transfer for the patient to [facility] with cardiology consulting. Patient has now arrived at [facility] step down unit and I am seeing her to ensure her condition remained stable post transport and then her orders have transferred appropriately. I find the patient alert and quite pleasant with several family members at the bedside. Although she feels some slight chest pressure she is not experiencing any chest pain. She currently denies nausea, diaphoresis, or dyspnea. Vital signs. Temperature 98.2, pulse 57, respiration 18, blood pressure 119/76. O2 sats 100% on low-flow nasal cannula oxygen. Gen. So well-developed middle-aged female in no distress. She is alert, pleasant, and cooperative. Cardiac. Heart sounds somewhat distant O. rate and rhythm regular. No murmur, S3, S4. No jugular venous distention or edema. Lungs. Breath sounds are clear and equal bilaterally. Abdomen. Soft with positive bowel sounds. No pain. Impression/plan. Problem #1 acute non-ST elevated myocardial infarction. Orders for heparin and nitroglycerin drip. Patient also on aspirin and Plavix. Metroprolol used for beta blocker. No current chest pain but still a slight sensation of pressure. I will repeat a 12-lead EKG. Did notify Dr. Rennis Golden of the patient's arrival. Cardiac enzymes will be trending. The patient appears clinically stable at this time. All orders appear to of transferred appropriately.

## 2012-07-13 NOTE — Care Management Note (Addendum)
    Page 1 of 1   07/15/2012     10:55:12 AM   CARE MANAGEMENT NOTE 07/15/2012  Patient:  [name], [name]   Account Number:  [id number]  Date Initiated:  07/13/2012  Documentation initiated by:  Junius Creamer  Subjective/Objective Assessment:   adm w mi     Action/Plan:   lives w husband, pcp dr Thalia Party mcginnis   Anticipated DC Date:  07/16/2012   Anticipated DC Plan:  HOME/SELF CARE      DC Planning Services  CM consult  Medication Assistance      Choice offered to / List presented to:             Status of service:  In process, will continue to follow Medicare Important Message given?   (If response is "NO", the following Medicare IM given date fields will be blank) Date Medicare IM given:   Date Additional Medicare IM given:    Discharge Disposition:    Per UR Regulation:  Reviewed for med. necessity/level of care/duration of stay  If discussed at Long Length of Stay Meetings, dates discussed:    Comments:  07-15-12 10:40am Avie Arenas, RNBSN [phone] Confirmed patient does have effient co pay card.  Husband came in during visit, in w/c with oxygen, who is independent.  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  Does smoke, but plans on trying to stop. Wants to [place] nicoderet gum or patches - understands these are OTC.  Has tried Chantix in the past and this has not worked.  7/16 10:28a debbie dowell rn,bsn pt has 30day free of effient and copay card. she has 78.00copay w ins and copay card may help w this copay.  7/15 10a debbie dowell rn,bsn [phone]

## 2012-07-13 NOTE — Plan of Care (Signed)
Problem: Phase I Progression Outcomes Goal: Aspirin unless contraindicated Outcome: Completed/Met Date Met:  07/13/12 Pt received 324mg  ASA at [facility] ED

## 2012-07-13 NOTE — H&P (Signed)
[facility] History and Physical  [name] ZOX:[id number] DOB: 11-16-1956 DOA: 07/13/2012    PCP:   Alice Reichert, MD   Chief Complaint:  Chest pain since yesterday  HPI: [name] is an 56 y.o. female.   Obese African American lady with a history of hypertension, tobacco abuse, presents with a history of intermittent central nonradiating chest pain/pressure since yesterday. Pain lasts typically about 30 minutes aggravated by walking relieved by rest, and was not relieved by antacids which she has been trying since she felt it was able to indigestion. Today the pains got much more intense as much as a 10 out of 10 in intensity, associated with nausea dizziness diaphoresis and difficulty breathing, and she therefore came to the emergency room EKG was done which showed no acute changes, she received aspirin and nitroglycerin paste her chest which brought which brought relief of the pain, and her cardiac enzymes were done which showed elevation of her troponin to 0.56. Dr. Rennis Golden at [facility] heart and vascular was contacted and she requested hospitalist admission with cardiology consult.  Heparin drip has been recommended and patient has been continued on the nitroglycerin topical paste.  Patient denies any previous episodes of similar pain, eyes black or bloody stool. Has been having a chronic nonproductive cough but notes that the pain is not aggravated by coughing or breathing. He does smoke a pack of cigarettes per day.  Patient is status post right knee replacement but continues to have pain in her right knee for which she takes Norco. She denies anxiety but takes Valium at bedtime for sleep.  Rewiew of Systems:   All systems negative except as marked or noted in the HPI;  Constitutional: Negative for malaise, fever and chills. ;  Eyes: Negative for eye pain, redness and discharge. ;  ENMT: Negative for ear pain, hoarseness, nasal congestion, sinus pressure and sore  throat. ;  Cardiovascular: Negative for  palpitations,  and peripheral edema. ;  Respiratory: Negative for , hemoptysis, wheezing and stridor. ;  Gastrointestinal: Negative for , vomiting, diarrhea, constipation, abdominal pain, melena, blood in stool, hematemesis, jaundice and rectal bleeding. unusual weight loss..   Genitourinary: Negative for frequency, dysuria, incontinence,flank pain and hematuria; Musculoskeletal: Negative for back pain and neck pain. Negative for swelling and trauma.;  Skin: . Negative for pruritus, rash, abrasions, bruising and skin lesion.; ulcerations Neuro: Negative for headache, lightheadedness and neck stiffness. Negative for weakness, altered level of consciousness , altered mental status, extremity weakness, burning feet, involuntary movement, seizure and syncope.  Psych: negative for anxiety, depression, insomnia, tearfulness, panic attacks, hallucinations, paranoia, suicidal or homicidal ideation     Past Medical History  Diagnosis Date  . Hypertension     Past Surgical History  Procedure Date  . Knee surgery     Medications:  HOME MEDS: Prior to Admission medications   Medication Sig Start Date End Date Taking? Authorizing Provider  benazepril-hydrochlorthiazide (LOTENSIN HCT) 20-25 MG per tablet Take 1 tablet by mouth daily.   Yes Historical Provider, MD  diazepam (VALIUM) 5 MG tablet Take 5 mg by mouth at bedtime.   Yes Historical Provider, MD  HYDROcodone-acetaminophen (NORCO) 7.5-325 MG per tablet Take 1 tablet by mouth every 6 (six) hours as needed. pain   Yes Historical Provider, MD     Allergies:  No Known Allergies  Social History:   reports that she has been smoking.  She does not have any smokeless tobacco history on file. She reports that  she does not drink alcohol. Her drug history not on file.  Family History: Family History  Problem Relation Age of Onset  . Hypertension Mother   . Hypertension Father   . Hypertension Sister    . Hypertension Brother   . Coronary artery disease Brother 1     Physical Exam: Filed Vitals:   07/12/12 2100 07/12/12 2200 07/12/12 2251 07/12/12 2300  BP: 145/99 147/127 148/91 136/95  Pulse: 69  64 61  Temp:   98.2 F (36.8 C)   TempSrc:   Oral   Resp: 16 16 20 16   Height:      Weight:      SpO2: 96%  96% 98%   Blood pressure 136/95, pulse 61, temperature 98.2 F (36.8 C), temperature source Oral, resp. rate 16, height 5\' 7"  (1.702 m), weight 108.863 kg (240 lb), SpO2 98.00%.  GEN:  Pleasant obese African American lady lying in the stretcher lying in the stretcher complaining of her consult with chest pain despite nitroglycerin paste; cooperative with exam PSYCH:  alert and oriented x4; HEENT: Mucous membranes pink and anicteric; PERRLA; EOM intact; no cervical lymphadenopathy nor thyromegaly or carotid bruit; no JVD; she does have a thick neck Breasts:: Not examined CHEST WALL: She does have mild chest wall tenderness tenderness CHEST: Normal respiration, clear to auscultation bilaterally HEART: Regular rate and rhythm; no murmurs rubs or gallops BACK:  no CVA tenderness ABDOMEN: Obese, soft non-tender; no masses, no organomegaly, normal abdominal bowel sounds;  no intertriginous candida. Rectal Exam: Not done EXTREMITIES: Right leg is slightly larger than the left related to to her surgery no edema; no ulcerations. Genitalia: not examined PULSES: 2+ and symmetric SKIN: Normal hydration no rash or ulceration CNS: Cranial nerves 2-12 grossly intact no focal lateralizing neurologic deficit   Labs on Admission:  Basic Metabolic Panel:  Lab 07/12/12 1610  NA 141  K 3.4*  CL 103  CO2 30  GLUCOSE 116*  BUN 13  CREATININE 1.02  CALCIUM 10.2  MG --  PHOS --   Liver Function Tests:  Lab 07/12/12 2125  AST 23  ALT 15  ALKPHOS 63  BILITOT 0.2*  PROT 7.0  ALBUMIN 3.5   No results found for this basename: LIPASE:5,AMYLASE:5 in the last 168 hours No results  found for this basename: AMMONIA:5 in the last 168 hours CBC:  Lab 07/12/12 2125  WBC 4.2  NEUTROABS --  HGB 13.4  HCT 41.0  MCV 86.9  PLT 242   Cardiac Enzymes: No results found for this basename: CKTOTAL:5,CKMB:5,CKMBINDEX:5,TROPONINI:5 in the last 168 hours BNP: No components found with this basename: POCBNP:5 CBG: No results found for this basename: GLUCAP:5 in the last 168 hours  Initial troponin I by i-STAT 0.5 6 repeat 3 hours later 0.64  Radiological Exams on Admission: Dg Chest Portable 1 View  07/12/2012  *RADIOLOGY REPORT*  Clinical Data: Chest pain, shortness of breath  PORTABLE CHEST - 1 VIEW  Comparison: 01/16/2011  Findings: Mild enlargement cardiomediastinal silhouette is noted. Fine detail is obscured by patient body habitus.  The lungs are clear.  No pleural effusion.  No acute osseous abnormality.  IMPRESSION: Mild cardiomegaly without focal acute finding.  Original Report Authenticated By: Harrel Lemon, M.D.    EKG: Independently reviewed. No clear ST segment elevation; she does have inverted T waves in the anterolateral leads.   Assessment/Plan Present on Admission:  . acute NSTEMI (non-ST elevated myocardial infarction)  Agree with starting heparin would, add a nitroglycerin  drip, start aspirin and Plavix, intravenous and oral beta blockers, she patient n.p.o. and transferred to [facility] to be seen by cardiologist as soon as possible   .HTN (hypertension)  This will likely be managed by beta blockers and nitroglycerin, we'll not restart her ACE inhibitor for the time being.   .Tobacco abuse  Will given nicotine replacement and tobacco cessation counseling.  This lady may need management in the intensive care unit but oh leave that to the cardiologist when they see her  GI prophylaxis with Protonix  Other plans as per orders.  Code Status: Full code Family Communication: Husband and sister at bedside at the time of interview and  examination with the permission of the patient, a plan of management and care discussed with patient in the presence of family members Disposition Plan: Uncertain [facility] for definitive management by on-call cardiologist  Critical care time: 60 minutes.   [place] Straus Nocturnist [facility] Pager [phone]   07/13/2012, 1:10 AM

## 2012-07-13 NOTE — Consult Note (Addendum)
Brief Consult Note:  NAME:  [name]   MRN: [id number] DOB:  Dec 03, 1956   ADMIT DATE: 07/12/2012   07/13/2012 8:43 AM  [name] is a 56 y.o. female with a PMH of HTN & smoking as well as a FH of premature CAD in her brother.  Besides having a recent sinus infection, she had been in her USOH until ~Saturday PM (7/13) when she had an episode of SSCP/Pressure while carrying groceries from the car to her home.  The Sx lasted ~30 min & resolved spontaneously.  Associated with mild Nausea, but no SOB or diaphoresis.  She had one more episode later that evening, but felt better afterwards.  The pain returned the following evening -- described as a pressure & this time with more nausea / indigestion and some mild dizziness.  Pain was ~10/10 at the worst --> she presented to [facility] ER & Sx initially improved with NTG SL & ASA.  WAs essentially "pain free" with some mild residual pressure.  Was transferred to [facility] & graciously admitted by the [facility] service -- placed on IV Heparin, NTG gtt, ASA & Plavix as well as low dose BB. She has remained relatively comfortable with occasional pressure -- made worse with getting up to the bathroom this AM.  Cardiovascular ROS: positive for - chest pain and pressure negative for - dyspnea on exertion, edema, irregular heartbeat, loss of consciousness, murmur, orthopnea, palpitations, paroxysmal nocturnal dyspnea, rapid heart rate or shortness of breath  Comprehensive Review of Systems - Negative except for sinus congestion with occasional headaches.  Notably -- No melena, hematochezia. No TIA/Amaurosis Fugax symptoms.  No syncope/presycope but mild dizziness.  No abdominal pain, or change in bowel habits.  No dysuria, hematuria.    Past Medical History  Diagnosis Date  . Hypertension    Past Surgical History  Procedure Date  . Knee surgery     FAMHx: Family History  Problem Relation Age of Onset  . Hypertension Mother   . Hypertension Father   . Hypertension  Sister   . Hypertension Brother   . Coronary artery disease Brother 64    SOCHx:  reports that she has been smoking.  She does not have any smokeless tobacco history on file. She reports that she does not drink alcohol. Her drug history not on file.  ALLERGIES: No Known Allergies  HOME MEDICATIONS: Prescriptions prior to admission  Medication Sig Dispense Refill  . benazepril-hydrochlorthiazide (LOTENSIN HCT) 20-25 MG per tablet Take 1 tablet by mouth daily.      . diazepam (VALIUM) 5 MG tablet Take 5 mg by mouth at bedtime.      Marland Kitchen HYDROcodone-acetaminophen (NORCO) 7.5-325 MG per tablet Take 1 tablet by mouth every 6 (six) hours as needed. pain        PHYSICAL EXAM:Blood pressure 122/81, pulse 73, temperature 98 F (36.7 C), temperature source Oral, resp. rate 18, height 5\' 7"  (1.702 m), weight 98.5 kg (217 lb 2.5 oz), SpO2 95.00%. General appearance: alert, cooperative, appears stated age, mild distress, moderately obese and pleasant mood & affect Neck: no adenopathy, no carotid bruit, no JVD, supple, symmetrical, trachea midline and thyroid not enlarged, symmetric, no tenderness/mass/nodules Lungs: clear to auscultation bilaterally and mild Bibasilar crackles relieved with cough., non-labored Heart: regular rate and rhythm, S1, S2 normal, no murmur, click, rub or gallop Abdomen: soft, non-tender; bowel sounds normal; no masses,  no organomegaly and obese Extremities: extremities normal, atraumatic, no cyanosis or edema and R Antecubital IV  has trace oozing blood from puncture site. Pulses: 2+ and symmetric Normal Allens' Skin: Skin color, texture, turgor normal. No rashes or lesions Neurologic: Grossly normal CN II-XII grossly intact  ECG:  SR, TWI inferior leads with biphasic ST-T segments in V1-V3 with TWI as well as more pronounced TWI in V4-5.  => concerning for LAD distribution ischemia. Labs reviewed in results section: Troponin now 2.0, BNP 1950.  K 3.6; BUN/Cr 11/0.77; Hgb  / Plt 11/243   IMPRESSION & PLAN Principal Problem:  *NSTEMI (non-ST elevated myocardial infarction) Active Problems:  HTN (hypertension)  Tobacco abuse  The patients' history has been reviewed, patient examined, no change in status from the [facility] Admission note.  I have reviewed the patients' chart and labs.  Agree with ASA, Heparin & BB, will increase statin dose (lipids reviewed, but in setting of ACS, not as accurate; also, pleiotropic effects of statins considered); was on ACE-I that can be restarted post cath.  As she continues to have residual pressure, I have discussed options of Medical Rx vs. Invasive RX including cardiac catheterization +/- PCI --  Questions were answered to the patient's satisfaction.   The procedure with indications, risks/complications was explained in detail.  Risks / Complications include, but not limited to: Death, MI, CVA/TIA, VF/VT (with defibrillation), Bradycardia (need for temporary pacer placement), contrast induced nephropathy, bleeding / bruising / hematoma / pseudoaneurysm, vascular or coronary injury (with possible emergent CT or Vascular Surgery), adverse medication reactions, infection.    After consideration of risks, benefits and other options for treatment, the patient has consented to Procedure(s):  LEFT HEART CATHETERIZATION AND CORONARY ANGIOGRAPHY +/- AD HOC PERCUTANEOUS CORONARY INTERVENTION   Will continue Clopidogrel for now,but may change to Prasugrel / Brilinta if PCI indicated.  Smoking cessation counseling  Cardiac Rehab consult post cath.  Further plans pending results of cardiac catheterization.  [facility] will take over as primary service.  Appreciate TR[facility] assistance.  HARDING,DAVID W [facility] [address] [place], [place]  [address]  [phone]  07/13/2012 8:43 AM

## 2012-07-14 DIAGNOSIS — M069 Rheumatoid arthritis, unspecified: Secondary | ICD-10-CM | POA: Insufficient documentation

## 2012-07-14 DIAGNOSIS — Z8249 Family history of ischemic heart disease and other diseases of the circulatory system: Secondary | ICD-10-CM

## 2012-07-14 DIAGNOSIS — I255 Ischemic cardiomyopathy: Secondary | ICD-10-CM | POA: Diagnosis present

## 2012-07-14 DIAGNOSIS — Z96659 Presence of unspecified artificial knee joint: Secondary | ICD-10-CM | POA: Insufficient documentation

## 2012-07-14 LAB — CBC
HCT: 36.3 % (ref 36.0–46.0)
MCHC: 32.5 g/dL (ref 30.0–36.0)
Platelets: 212 10*3/uL (ref 150–400)
RDW: 14.3 % (ref 11.5–15.5)

## 2012-07-14 LAB — BASIC METABOLIC PANEL
BUN: 9 mg/dL (ref 6–23)
GFR calc Af Amer: >90 mL/min (ref >90)
GFR calc non Af Amer: 83 mL/min — ABNORMAL LOW (ref >90)
Potassium: 3.5 mEq/L (ref 3.5–5.1)
Sodium: 144 mEq/L (ref 135–145)

## 2012-07-14 MED ORDER — METOPROLOL TARTRATE 25 MG PO TABS
25.0000 mg | ORAL_TABLET | Freq: Two times a day (BID) | ORAL | Status: DC
  Administered 2012-07-14 – 2012-07-15 (×3): 25 mg via ORAL
  Filled 2012-07-14 (×3): qty 1

## 2012-07-14 MED ORDER — LISINOPRIL 10 MG PO TABS
10.0000 mg | ORAL_TABLET | Freq: Every day | ORAL | Status: DC
  Administered 2012-07-14 – 2012-07-15 (×2): 10 mg via ORAL
  Filled 2012-07-14 (×2): qty 1

## 2012-07-14 MED ORDER — ROSUVASTATIN CALCIUM 5 MG PO TABS
5.0000 mg | ORAL_TABLET | Freq: Every day | ORAL | Status: DC
  Administered 2012-07-14: 5 mg via ORAL
  Filled 2012-07-14 (×2): qty 1

## 2012-07-14 MED ORDER — NITROGLYCERIN 0.4 MG SL SUBL: 0.4000 mg | SUBLINGUAL_TABLET | SUBLINGUAL | Status: DC | PRN

## 2012-07-14 MED ORDER — ZOLPIDEM TARTRATE 5 MG PO TABS: 10.0000 mg | ORAL_TABLET | Freq: Every evening | ORAL | Status: DC | PRN

## 2012-07-14 MED ORDER — ALPRAZOLAM 0.25 MG PO TABS: 0.2500 mg | ORAL_TABLET | Freq: Three times a day (TID) | ORAL | Status: DC | PRN

## 2012-07-14 MED ORDER — POTASSIUM CHLORIDE CRYS ER 20 MEQ PO TBCR
20.0000 meq | EXTENDED_RELEASE_TABLET | Freq: Once | ORAL | Status: AC
  Administered 2012-07-14: 20 meq via ORAL
  Filled 2012-07-14: qty 1

## 2012-07-14 MED FILL — Dextrose Inj 5%: INTRAVENOUS | Qty: 50 | Status: AC

## 2012-07-14 NOTE — Progress Notes (Signed)
CARDIAC REHAB PHASE I   PRE:  Rate/Rhythm: 66SR  BP:  Supine: 140/96  Sitting:   Standing:    SaO2:   MODE:  Ambulation: 700 ft   POST:  Rate/Rhythem: 89SR  BP:  Supine: 150/89  Sitting:   Standing:    SaO2:  0950-1100 Pt walked 700 ft on RA with steady gait. No CP. Tolerated well. Education completed. Permission given to refer to [place] Phase 2. Discussed smoking cessation.  Duanne Limerick

## 2012-07-14 NOTE — Progress Notes (Signed)
Pt. Seen and examined. Agree with the NP/PA-C note as written.  Total LAD occlusion, may be subacute - no clear ST elevation. Feels much better today. Will need cardiac rehabilitation. She has been intolerant to lipitor in the past. Will start Crestor. Plan outpatient 2D echocardiogram. Restart low dose ACE-I. Transfer to tele bed. Probable d/c home tomorrow.  Chrystie Nose, MD, [facility] Attending Cardiologist [facility]

## 2012-07-14 NOTE — Progress Notes (Signed)
Subjective:  No chest pain, "100% better"  Objective:  Vital Signs in the last 24 hours: Temp:  [97.9 F (36.6 C)-98.2 F (36.8 C)] 98.1 F (36.7 C) (07/16 0335) Pulse Rate:  [58-72] 67  (07/16 0335) Resp:  [18-22] 20  (07/16 0335) BP: (95-127)/(54-75) 127/75 mmHg (07/16 0335) SpO2:  [96 %-99 %] 98 % (07/16 0335)  Intake/Output from previous day:  Intake/Output Summary (Last 24 hours) at 07/14/12 0831 Last data filed at 07/14/12 0700  Gross per 24 hour  Intake   1505 ml  Output    700 ml  Net    805 ml    Physical Exam: General appearance: alert, cooperative and no distress Lungs: clear to auscultation bilaterally Heart: regular rate and rhythm Rt wrist without hematoma    Rate: 66  Rhythm: normal sinus rhythm  Lab Results:  Basename 07/14/12 0445 07/13/12 0505  WBC 4.5 5.0  HGB 11.8* 11.9*  PLT 212 243    Basename 07/14/12 0445 07/13/12 0505  NA 144 139  K 3.5 3.6  CL 110 104  CO2 25 25  GLUCOSE 89 99  BUN 9 11  CREATININE 0.80 0.77    Basename 07/13/12 0906 07/13/12 0505  TROPONINI 2.79* 2.00*   Hepatic Function Panel  Basename 07/12/12 2125  PROT 7.0  ALBUMIN 3.5  AST 23  ALT 15  ALKPHOS 63  BILITOT 0.2*  BILIDIR --  IBILI --    Basename 07/13/12 0505  CHOL 165    Basename 07/13/12 0505  INR 1.04    Imaging: Imaging results have been reviewed  Cardiac Studies:  Assessment/Plan:   Principal Problem:  *NSTEMI (non-ST elevated myocardial infarction) Active Problems:  LAD DES placed 07/13/12  Ischemic cardiomyopathy, EF 40-45%  HTN (hypertension)  Tobacco abuse  Family history of coronary artery disease, brother had MI at 26  Plan- Tx to telemetry. Keep till- ?Thursday AM. Consider adding ACE before discharge. Check 2D as an OP later.    Corine Shelter PA-C 07/14/2012, 8:31 AM

## 2012-07-15 HISTORY — PX: CORONARY STENT PLACEMENT: SHX1402

## 2012-07-15 LAB — BASIC METABOLIC PANEL
BUN: 10 mg/dL (ref 6–23)
CO2: 24 mEq/L (ref 19–32)
Calcium: 9.1 mg/dL (ref 8.4–10.5)
Chloride: 109 mEq/L (ref 96–112)
Creatinine, Ser: 0.79 mg/dL (ref 0.50–1.10)
GFR calc Af Amer: >90 mL/min (ref >90)
GFR calc non Af Amer: >90 mL/min (ref >90)
Glucose, Bld: 94 mg/dL (ref 70–99)
Potassium: 4.1 mEq/L (ref 3.5–5.1)
Sodium: 143 mEq/L (ref 135–145)

## 2012-07-15 MED ORDER — ROSUVASTATIN CALCIUM 5 MG PO TABS: 5.0000 mg | ORAL_TABLET | Freq: Every day | ORAL | Status: DC

## 2012-07-15 MED ORDER — ASPIRIN 81 MG PO CHEW: 81.0000 mg | CHEWABLE_TABLET | Freq: Every day | ORAL | Status: AC

## 2012-07-15 MED ORDER — LISINOPRIL 10 MG PO TABS: 10.0000 mg | ORAL_TABLET | Freq: Every day | ORAL | Status: DC

## 2012-07-15 MED ORDER — NITROGLYCERIN 0.4 MG SL SUBL: 0.4000 mg | SUBLINGUAL_TABLET | SUBLINGUAL | Status: DC | PRN

## 2012-07-15 MED ORDER — PRASUGREL HCL 10 MG PO TABS: 10.0000 mg | ORAL_TABLET | Freq: Every day | ORAL | Status: DC

## 2012-07-15 MED ORDER — METOPROLOL TARTRATE 25 MG PO TABS: 25.0000 mg | ORAL_TABLET | Freq: Two times a day (BID) | ORAL | Status: DC

## 2012-07-15 NOTE — Progress Notes (Signed)
[facility]  Subjective: No CP or SOB.  She is ready to go home.  Objective: Vital signs in last 24 hours: Temp:  [97.7 F (36.5 C)-98.4 F (36.9 C)] 98.4 F (36.9 C) (07/17 0427) Pulse Rate:  [67-69] 68  (07/17 0427) Resp:  [20] 20  (07/17 0427) BP: (127-143)/(81-85) 127/81 mmHg (07/17 0427) SpO2:  [93 %-96 %] 96 % (07/17 0427) Last BM Date: 07/14/12  Intake/Output from previous day: 07/16 0701 - 07/17 0700 In: 483 [P.O.:480; I.V.:3] Out: -  Intake/Output this shift:    Medications Current Facility-Administered Medications  Medication Dose Route Frequency Provider Last Rate Last Dose  . 0.9 %  sodium chloride infusion  250 mL Intravenous Continuous Marykay Lex, MD      . acetaminophen (TYLENOL) tablet 650 mg  650 mg Oral Q4H PRN Marykay Lex, MD   650 mg at 07/14/12 0245  . ALPRAZolam Prudy Feeler) tablet 0.25 mg  0.25 mg Oral TID PRN Abelino Derrick, PA      . aspirin chewable tablet 81 mg  81 mg Oral Daily Marykay Lex, MD   81 mg at 07/14/12 0941  . diazepam (VALIUM) tablet 5 mg  5 mg Oral QHS Vania Rea, MD   5 mg at 07/14/12 2120  . lisinopril (PRINIVIL,ZESTRIL) tablet 10 mg  10 mg Oral Daily Eda Paschal [place], [place]   10 mg at 07/14/12 1731  . metoprolol tartrate (LOPRESSOR) tablet 25 mg  25 mg Oral BID Marykay Lex, MD   25 mg at 07/14/12 2120  . morphine 2 MG/ML injection 2 mg  2 mg Intravenous Q1H PRN Marykay Lex, MD      . nitroGLYCERIN (NITROSTAT) SL tablet 0.4 mg  0.4 mg Sublingual Q5 Min x 3 PRN Abelino Derrick, PA      . ondansetron [facility]) injection 4 mg  4 mg Intravenous Q6H PRN Marykay Lex, MD      . pantoprazole (PROTONIX) EC tablet 40 mg  40 mg Oral Q1200 Vania Rea, MD   40 mg at 07/14/12 1159  . potassium chloride SA (K-DUR,KLOR-CON) CR tablet 20 mEq  20 mEq Oral Once Abelino Derrick, [place]   20 mEq at 07/14/12 0943  . prasugrel (EFFIENT) tablet 10 mg  10 mg Oral Daily Marykay Lex, MD   10 mg at 07/14/12 0941   . rosuvastatin (CRESTOR) tablet 5 mg  5 mg Oral q1800 Eda Paschal [place], [place]   5 mg at 07/14/12 1731  . sodium chloride 0.9 % injection 3 mL  3 mL Intravenous Q12H Marykay Lex, MD   3 mL at 07/14/12 2121  . sodium chloride 0.9 % injection 3 mL  3 mL Intravenous PRN Marykay Lex, MD      . zolpidem [facility]) tablet 10 mg  10 mg Oral QHS PRN Abelino Derrick, [place]      . DISCONTD: metoprolol tartrate (LOPRESSOR) tablet 25 mg  25 mg Oral BID Marykay Lex, MD   25 mg at 07/13/12 2205    PE: General appearance: alert, cooperative, no distress and Sitting on the edge of the bed. Lungs: clear to auscultation bilaterally Heart: regular rate and rhythm, S1, S2 normal, no murmur, click, rub or gallop Extremities: No LEE Pulses: 2+ and symmetric Skin: Warm and dry Neurologic: Grossly normal  Lab Results:   Basename 07/14/12 0445 07/13/12 0505 07/12/12 2125  WBC 4.5 5.0 4.2  HGB 11.8* 11.9*  13.4  HCT 36.3 36.3 41.0  PLT 212 243 242   BMET  Basename 07/15/12 0555 07/14/12 0445 07/13/12 0505  NA 143 144 139  K 4.1 3.5 3.6  CL 109 110 104  CO2 24 25 25   GLUCOSE 94 89 99  BUN 10 9 11   CREATININE 0.79 0.80 0.77  CALCIUM 9.1 8.7 9.3   PT/INR  Basename 07/13/12 0505 07/12/12 2125  LABPROT 13.8 12.5  INR 1.04 0.91   Cholesterol  Basename 07/13/12 0505  CHOL 165   Cardiac Enzymes No components found with this basename: TROPONIN:3, CKMB:3  Studies/Results: @RISRSLT2 @   Assessment/Plan  Principal Problem:  *NSTEMI (non-ST elevated myocardial infarction) Active Problems:  HTN (hypertension)  Tobacco abuse  LAD DES placed 07/13/12  Family history of coronary artery disease, brother had MI at 34  Ischemic cardiomyopathy, EF 40-45%  Plan:  S/P complex PCI of the Mid LAD 100% thrombotic bifurcation occlusion with 100% D1 occlusion along with a more distal mid LAD 70% lesion at D2 using 2 overlapping Xience Expedition DES Stents this past Monday.  BP and HR controlled.   ASA/Effient/lisinopril/lopressor/crestor.  Ambulating with cardiac reb without difficulty.  OP 2D echo.  FU with Dr. Rennis Golden in [place].  DC home today.    LOS: 3 days    HAGER, BRYAN 07/15/2012 9:41 AM  I have seen and examined the patient along with Wilburt Finlay, PA.  I have reviewed the chart, notes and new data.  I agree with PA's note.  Key new complaints: feels well, no dyspnea or angina with ambulation Key examination changes: no clinical signs of HF; no arrhythmia Key new findings / data: LVEF 40-45%, likely partly stunned myocardium as well as scar  PLAN: DC home. Discussed signs and symptoms of CHF, recurrent angina and steps to take if they occur. ACEi/beta blocker for post MI LV dysfunction. Over 20 minutes spent discussing purpose of and methods to achieve smoking cessation. Diet/statin. Cardiac rehab. ASA/Effient discussed - reviewed risk of acute stent thrombosis with noncompliance. Outpatient echo in 1-2 weeks then f/u w Dr. Rennis Golden. Stay off work until then.  >30 minutes DC planning.  Thurmon Fair, MD, [facility] [phone] 07/15/2012, 12:17 PM

## 2012-07-15 NOTE — CV Procedure (Signed)
[facility] PERCUTANEOUS CORONARY INTERVENTION REPORT  NAME:  DELANIE Stanley   MRN: [id number] DOB:  1956-12-04   ADMIT DATE: 07/12/2012  INTERVENTIONAL CARDIOLOGIST: Marykay Lex, M.D., MS PRIMARY CARE PROVIDER: Alice Reichert, MD PRIMARY CARDIOLOGIST:  [facility] [place]   PATIENT:  [name] is a 56 y.o. female with a PMH of HTN and long term tobacco use with a significant FH of CAD (brother CAD in 26s) who presented to [facility] ER with ACS- troponin + c/w NSTEMI (Troponin 2.0) & significant Anterior ST-T changes on ECG c/w LAD ischemia. Chest "pain free" overnight on NTG, but with residual pressure made worse with getting up to bathroom.  IV NTG & Heparin, ASA & Plavix overnight. She is now referred for cardiac catheterization.  PRE-OPERATIVE DIAGNOSIS:    NSTEMI  Abnormal ECG with LAD distribution changes - Anteroseptal biphasic ST-Ts with TWI, Anterolateral TWI  HTN   Long term smoker  PROCEDURES PERFORMED:    LEFT HEART CATHETERIZATION WITH CORONARY ANGIOGRAM    COMPLEX MULTI-SITE PERCUTANEOUS CORONARY STENT INTERVENTION (PCI-S) of the Mid LAD 100% thrombotic bifurcation occlusion with 100% D1 occlusion along with a more distal mid LAD 70% hazy lesion at D2 using. 2 Overlapping Xience Expedition DES stents. Lesion #1 (proximal 100% occlusion site at Diag 1) -- Xience Expedition DES 3.0 mm x 18 mm;  post dilated proximally to 3.35 mm, 3.30 mid Lesion #2 (70% lesion at Diag 2) 2.75 mm x 28 mm distal; post dilated proximally to 2.8 mm distally and 3.67mm at the overlap site.  Rescue PTCA of the jailed Diag1 ostium using a 2.69mm x 8 mm Emerge MR balloon  PROCEDURE: Consent: The procedure with Risks/Benefits/Alternatives and Indications was reviewed with the patient.  All questions were answered.    Risks / Complications include, but not limited to: Death, MI, CVA/TIA, VF/VT (with defibrillation), Bradycardia (need for  temporary pacer placement), contrast induced nephropathy, bleeding / bruising / hematoma / pseudoaneurysm, vascular or coronary injury (with possible emergent CT or Vascular Surgery), adverse medication reactions, infection.    The patient voiced understanding and agree to proceed.    Consent for signed by MD and patient with RN witness -- placed on chart.  PROCEDURE: The patient was brought to the 2nd Floor [facility] in the fasting state and prepped and draped in the usual sterile fashion for Right radial or groin access.   A modified Allen's test with plethysmography was performed on the right wrist demonstrating adequate Ulnar Artery collateral flow.  Sterile technique was used including antiseptics, cap, gloves, gown, hand hygiene, mask and sheet.  Skin prep: Chlorhexidine;  Time Out: Verified patient identification, verified procedure, site/side was marked, verified correct patient position, special equipment/implants available, medications/allergies/relevent history reviewed, required imaging and test results available.  Performed  Access: Right Radial Artery; 5 Fr Glide Sheath; Seldinger technique. Diagnostic:  5 Fr diagnostic catheters, TIG 4.0 advanced over Versiocre wire, exchanges over Marriott wire.  TIG 4.0 -- Left and Right Coronary Angiography;   Angled Pigtail -- LV Hemodynamics (LV Gram)  Hemodynamics:  Central Aortic / Mean Pressures: 138/85 mmHg; 105 mmHg mean (post PCI)  Left Ventricular Pressures / EDP: Pre PCI 129/15 mmHg with EDP 27 mmHg -- Post PCI 136/17 mmHg, EDP 21 mmHg  Left Ventriculography:  EF: ~40-45% %  Wall Motion: Mid-distal Anterior, Anterolateral and Apical Moderate Hypokinesis  Coronary Anatomy:  Left Main: Large caliber, very long  tortuous vessel that gives off the LAD as well as a nondominant Left Circumflex and what looks to be  Ramuss Intermedius; minimal luminal irregularities.  LAD:  Large caliber  vessel that is 100% occluded in the midportion (1,1,1 bifurcation lesion; Lesion #1)  at what appears to be a branch point of a septal trunk and first diagonal branch (Diagonal 1).  There are left to left collaterals from the obtuse marginal and ramus branches that fill Diagonal 1 As Well as distally in the LAD. The distal LAD is also filled via right-to-left collaterals.  Post PCI revealed a very tortuous distal LAD with an additional 75% lesion at the Diagonal 2 takeoff followed by a 50% lesion at a "hinge point".  (Lesion #2; 1,1,0 bifurcation lesion).   The vessel then courses down around the apex distally.  Diagonal 1:  Moderate to small (2.2 mm) vessel involved in the Lesion #1 that was also occluded as part of the original lesion.  Diagonal 2:  Small caliber vessel, not involved with Lesion #2 Left Circumflex:  nondominant, moderate caliber vessel that courses as a inferolateral marginal branch that is very tortuous and its course. It does give off a small AV groove branch distally.  Minimal luminal irregularities. Ramus intermedius:  moderate to large caliber vessel the branches in the mid vessel giving off 2 equally sized branches covering most of the anterolateral wall; minimal luminal irregularities.  RCA:  Large-caliber, almost ectatic appearing, dominant vessel with a severe "[place] bend"; there is a roughly 40-50% lesion in the mid vessel.  The vessel bifurcates distally into The Right Posterior AV Groove Branch and Right Posterior Descending Artery (RPDA)  RPDA:  large caliber, extensive vessel reaching nearly to the apex; diffuse luminal irregularities  RPL Sysytem:The Right Posterior AV Groove Branch  has a proximal 40-50% lesion before gives off a small posterior lateral branch, and then gives off the AV nodal artery and a very tortuous course before bifurcating into 2 terminal moderate caliber right posterior lateral branches with diffuse luminal irregularities.  After  reviewing the initial cineangiography images, the culprit lesion was identified, and the decision was made to proceed with percutaneous coronary intervention on the 100% occluded LAD.  Percutaneous Coronary Intervention:  Sheath exchanged for 6 Fr Guide: 6 Fr   XB LAD 3.5 Guidewire: BMW for LAD;   Lesion #1: mid LAD with 100% thrombotic occlusion at the bifurcation with Diag1 (1,1,1); TIMI 0 flow  Reduced to 0% with TIMI 3 flow Predilation Balloon: Emerge Monorail 2.5 mm x 12 mm;   6 Atm x 30 Sec Intracoronary Nitroglycerin 200 mcg Stent #1: Xience Expedition 3.0 mm x 18 mm;   12 Atm x 30 Sec, 16 Atm x 50 Sec Following stent placement and Intracoronary Nitroglycerin injection, there was TIMI-3 flow was started however Lesion #2 was reveals a more significant lesion of 70-75%, hazy appearing, therefore the decision was made to proceed with PCI of this lesion as well however intervening segment between the Stent #1 and a new stent had irregularities, therefore the decision was made to overlap the stent  Lesion #2: ~70-75% hazy lesion at the bifurcation of D2 (1,1,0); TIMI-3 flow Stent #2: Xience Expedition 2.75 mm x 28 mm;   12 Atm x 45 Sec, 18 Atm x 50 Sec -- final distal diameter 2.8 mm Intercoronary Nitroglycerin 200 mcg  Post-dilation Balloon for stent overlap and proximal Stent #1: [place] Quantum Apex 3.25 mm x  15 mm;   16 Atm  x  50 Sec,  16 Atm x  60 Sec -- proximal Stent #1, overlap of stents; 14 mm x 60 Sec proximal Stent #2  Final Diameter: 3.3 mm at stent overlap and proximal stent #2; 3.35 mm stent #1  Intercoronary Nitroglycerin 200 mcg  Scout angiography did not reveal evidence of dissection or perforation; however, the previously occluded Diagonal1 branch despite having TIMI-3 flow had significant and 90+ % ostial stenosis.  Therefore, decision was made to proceed with rescue PTCA of this vessel as it covers a large distribution. Diagonal 1 PTCA: Ostial 90% stenosis -- reduced  to 10-20% residual stenosis with TIMI-3 flow Guidewire: Choice PT2 Light support  Balloon: Emerge Monorail 2.0 mm x 8 mm  8 Atm x 60 Sec - into proximal Diagonal 1,  12 Atm x  60 Sec - at ostium and into the stented LAD --  Intercoronary Nitroglycerin 200 mcg  Post-deployment cineangiography with and without guidewire in place was performed in multiple orthogonal views demonstrating excellent stent deployment with minimal residual stenosis in the Diagonal 1 branch.  There was no evidence of dissection or perforation.  This is a very complex, difficult procedure for several reasons: 100% thrombotic occluded (subacute) lesion and a very tortuous LAD there was difficulty wire, there were 2 bifurcation lesions in a very long disease segment requiring 2 long overlapping stents with the second one being passed distally to the first as this area is not actively visualize until the first stent was placed, the larger of the 2 diagonal branches been required rescue PTCA requiring 2 passes a wire through the newly stented segment into this vessel which is a 90 angle takeoff.   After completion of the PCI procedure, the guide catheter was exchanged for an angled pigtail catheter for LV Hemodynamics and Left Ventriculography. Following the Aortic Valve Pullback, the catheter was removed to be out of the body over.  TR Band:   1300 Hours,  15 mL air  ANESTHESIA:   Local Lidocaine  2 ml SEDATION:   1 mg IV Versed,  100 mcg IV fentanyl ; Premedication:  5 mg by mouth Valium MEDICATIONS:  Anticoagulation: for radial cath: IV Heparin 4000 units; for PCI: Angiomax bolus and drip throughout PCI   Anti-Platelet Agent: Effient 60 mg by mouth   EBL:   < 10 ml  PATIENT DISPOSITION:    The patient was transferred to the PACU holding area in a hemodynamicaly stable, chest pain free condition.  The patient tolerated the procedure well, and there were no complications.  The patient was stable before, during, and  after the procedure.  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  Convert antiplatelet agent from Plavix to Effient - with a plan to continue dual antiplatelet for minimum 1 year uninterrupted, would likely continue indefinitely due to extensive at stented segment in the LAD.   Continue to to titrate up beta blocker dose and restart ACE inhibitor in the morning.  Anticipate transfer for tomorrow and discharge on 7/17.    Outpatient Echocardiogram to reassess EF.   She will follow-up with Dr. Rennis Golden at the [facility]  office.   Marykay Lex, M.D., M.S. [facility] [address] [place], [place]  [address]  [phone]  07/15/2012 8:13 PM

## 2012-07-20 NOTE — Discharge Summary (Signed)
Physician Discharge Summary  Patient ID: [name] MRN: [id number] DOB/AGE: 1956/07/25 56 y.o.  Admit date: 07/12/2012 Discharge date: 07/20/2012  Admission Diagnoses: Non-ST elevation myocardial infarction  Discharge Diagnoses:  Principal Problem:  *NSTEMI (non-ST elevated myocardial infarction) Active Problems:  HTN (hypertension)  Tobacco abuse  LAD DES placed 07/13/12  Family history of coronary artery disease, brother had MI at 74  Ischemic cardiomyopathy, EF 40-45%   Discharged Condition: stable  Hospital Course:   [name] is an obese 56 y.o. AA  Female with a history of hypertension, tobacco abuse, presented with a history of intermittent central nonradiating chest pain/pressure. Pain lasts typically about 30 minutes aggravated by walking relieved by rest, and was not relieved by antacids which she has been trying since she felt it was able to indigestion. Today the pain got much more intense as much as a 10 out of 10 in intensity, associated with nausea dizziness diaphoresis and difficulty breathing, and she therefore came to the emergency room EKG was done which showed no acute changes, she received aspirin and nitroglycerin paste her chest which brought relief of the pain, and her cardiac enzymes were done which showed elevation of her troponin to 0.56. Heparin drip was started and patient has been continued on the nitroglycerin topical paste.  She does smoke a pack of cigarettes per day. Patient is status post right knee replacement but continues to have pain in her right knee for which she takes Norco. She denied anxiety but takes Valium at bedtime for sleep.  She was admitted and started on IV nitro, heparin as well as ASA and plavix. 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  Patient received 2 overlapping Xience Expedition DES stents.  The patient felt much best better after receiving the stents and had resolution of chest pain.  Cardiac rehabilitation was initiated. She was started on Crestor, effient, low-dose ACE inhibitor. She was scheduled for outpatient 2-D echocardiogram.      Over 20 minutes was spent discussing purpose and benefits were achieving smoking cessation.   The patient was discharged in stable condition after being seen by Dr. Royann Shivers.  She'll followup with Dr. Rennis Golden in approximately one to 2 weeks and has been advised to stay off work until then.  Consults: None  Significant Diagnostic Studies:  PRE-OPERATIVE DIAGNOSIS:  NSTEMI  Abnormal ECG with LAD distribution changes - Anteroseptal biphasic ST-Ts with TWI, Anterolateral TWI  HTN  Long term smoker PROCEDURES PERFORMED:  LEFT HEART CATHETERIZATION WITH CORONARY ANGIOGRAM  COMPLEX MULTI-SITE PERCUTANEOUS CORONARY STENT INTERVENTION (PCI-S) of the Mid LAD 100% thrombotic bifurcation occlusion with 100% D1 occlusion along with a more distal mid LAD 70% hazy lesion at D2 using. 2 Overlapping Xience Expedition DES stents. Lesion #1 (proximal 100% occlusion site at Diag 1) -- Xience Expedition DES 3.0 mm x 18 mm; post dilated proximally to 3.35 mm, 3.30 mid  Lesion #2 (70% lesion at Diag 2) 2.75 mm x 28 mm distal; post dilated proximally to 2.8 mm distally and 3.85mm at the overlap site.  Rescue PTCA of the jailed Diag1 ostium using a 2.73mm x 8 mm Emerge MR balloon PROCEDURE:  Consent: The procedure with Risks/Benefits/Alternatives and Indications was reviewed with the patient. All questions were answered.  Risks / Complications  include, but not limited to: Death, MI, CVA/TIA, VF/VT (with defibrillation), Bradycardia (need for temporary pacer placement), contrast induced nephropathy, bleeding / bruising / hematoma / pseudoaneurysm, vascular or coronary  injury (with possible emergent CT or Vascular Surgery), adverse medication reactions, infection.  The patient voiced understanding and agree to proceed.  Consent for signed by MD and patient with RN witness -- placed on chart.  PROCEDURE: The patient was brought to the 2nd Floor [facility] in the fasting state and prepped and draped in the usual sterile fashion for Right radial or groin access. A modified Allen's test with plethysmography was performed on the right wrist demonstrating adequate Ulnar Artery collateral flow. Sterile technique was used including antiseptics, cap, gloves, gown, hand hygiene, mask and sheet.  Skin prep: Chlorhexidine;  Time Out: Verified patient identification, verified procedure, site/side was marked, verified correct patient position, special equipment/implants available, medications/allergies/relevent history reviewed, required imaging and test results available. Performed  Access: Right Radial Artery; 5 Fr Glide Sheath; Seldinger technique.  Diagnostic: 5 Fr diagnostic catheters, TIG 4.0 advanced over Versiocre wire, exchanges over [place]ott wire.  TIG 4.0 -- Left and Right Coronary Angiography;  Angled Pigtail -- LV Hemodynamics (LV Gram) Hemodynamics:  Central Aortic / Mean Pressures: 138/85 mmHg; 105 mmHg mean (post PCI) Left Ventricular Pressures / EDP: Pre PCI 129/15 mmHg with EDP 27 mmHg -- Post PCI 136/17 mmHg, EDP 21 mmHg Left Ventriculography:  EF: ~40-45% % Wall Motion: Mid-distal Anterior, Anterolateral and Apical Moderate Hypokinesis Coronary Anatomy:  Left Main: Large caliber, very long tortuous vessel that gives off the LAD as well as a nondominant Left Circumflex and what looks to be Ramuss Intermedius; minimal luminal irregularities.  LAD: Large caliber vessel that is 100% occluded in the midportion (1,1,1 bifurcation lesion; Lesion #1) at what appears to be a branch point of a septal trunk and first diagonal  branch (Diagonal 1). There are left to left collaterals from the obtuse marginal and ramus branches that fill Diagonal 1 As Well as distally in the LAD. The distal LAD is also filled via right-to-left collaterals.  Post PCI revealed a very tortuous distal LAD with an additional 75% lesion at the Diagonal 2 takeoff followed by a 50% lesion at a "hinge point". (Lesion #2; 1,1,0 bifurcation lesion). The vessel then courses down around the apex distally. Diagonal 1: Moderate to small (2.2 mm) vessel involved in the Lesion #1 that was also occluded as part of the original lesion.  Diagonal 2: Small caliber vessel, not involved with Lesion #2 Left Circumflex: nondominant, moderate caliber vessel that courses as a inferolateral marginal branch that is very tortuous and its course. It does give off a small AV groove branch distally. Minimal luminal irregularities.  Ramus intermedius: moderate to large caliber vessel the branches in the mid vessel giving off 2 equally sized branches covering most of the anterolateral wall; minimal luminal irregularities.  RCA: Large-caliber, almost ectatic appearing, dominant vessel with a severe "Shepherd's Crook bend"; there is a roughly 40-50% lesion in the mid vessel. The vessel bifurcates distally into The Right Posterior AV Groove Branch and Right Posterior Descending Artery (RPDA)  RPDA: large caliber, extensive vessel reaching nearly to the apex; diffuse luminal irregularities  RPL Sysytem:The Right Posterior AV Groove Branch has a proximal 40-50% lesion before gives off a small posterior lateral branch, and then gives off the AV nodal artery and a very tortuous course before bifurcating into 2 terminal moderate caliber right posterior lateral  branches with diffuse luminal irregularities. After reviewing the initial cineangiography images, the culprit lesion was identified, and the decision was made to proceed with percutaneous coronary intervention on the 100% occluded LAD.   Percutaneous Coronary Intervention: Sheath exchanged for 6 Fr  Guide: 6 Fr XB LAD 3.5 Guidewire: BMW for LAD;  Lesion #1: mid LAD with 100% thrombotic occlusion at the bifurcation with Diag1 (1,1,1); TIMI 0 flow Reduced to 0% with TIMI 3 flow Predilation Balloon: Emerge Monorail 2.5 mm x 12 mm;  6 Atm x 30 Sec Intracoronary Nitroglycerin 200 mcg  Stent #1: Xience Expedition 3.0 mm x 18 mm;  12 Atm x 30 Sec, 16 Atm x 50 Sec Following stent placement and Intracoronary Nitroglycerin injection, there was TIMI-3 flow was started however Lesion #2 was reveals a more significant lesion of 70-75%, hazy appearing, therefore the decision was made to proceed with PCI of this lesion as well however intervening segment between the Stent #1 and a new stent had irregularities, therefore the decision was made to overlap the stent  Lesion #2: ~70-75% hazy lesion at the bifurcation of D2 (1,1,0); TIMI-3 flow Stent #2: Xience Expedition 2.75 mm x 28 mm;  12 Atm x 45 Sec, 18 Atm x 50 Sec -- final distal diameter 2.8 mm Intercoronary Nitroglycerin 200 mcg  Post-dilation Balloon for stent overlap and proximal Stent #1: [place] Quantum Apex 3.25 mm x 15 mm;  16 Atm x 50 Sec, 16 Atm x 60 Sec -- proximal Stent #1, overlap of stents; 14 mm x 60 Sec proximal Stent #2 Final Diameter: 3.3 mm at stent overlap and proximal stent #2; 3.35 mm stent #1  Intercoronary Nitroglycerin 200 mcg  Scout angiography did not reveal evidence of dissection or perforation; however, the previously occluded Diagonal1 branch despite having TIMI-3 flow had significant and 90+ % ostial stenosis. Therefore, decision was made to proceed with rescue PTCA of this vessel as it covers a large distribution.  Diagonal 1 PTCA: Ostial 90% stenosis -- reduced to 10-20% residual stenosis with TIMI-3 flow  Guidewire: Choice PT2 Light support  Balloon: Emerge Monorail 2.0 mm x 8 mm  8 Atm x 60 Sec - into proximal Diagonal 1, 12 Atm x 60 Sec - at ostium and into  the stented LAD --  Intercoronary Nitroglycerin 200 mcg  Post-deployment cineangiography with and without guidewire in place was performed in multiple orthogonal views demonstrating excellent stent deployment with minimal residual stenosis in the Diagonal 1 branch. There was no evidence of dissection or perforation.  This is a very complex, difficult procedure for several reasons: 100% thrombotic occluded (subacute) lesion and a very tortuous LAD there was difficulty wire, there were 2 bifurcation lesions in a very long disease segment requiring 2 long overlapping stents with the second one being passed distally to the first as this area is not actively visualize until the first stent was placed, the larger of the 2 diagonal branches been required rescue PTCA requiring 2 passes a wire through the newly stented segment into this vessel which is a 90 angle takeoff.  After completion of the PCI procedure, the guide catheter was exchanged for an angled pigtail catheter for LV Hemodynamics and Left Ventriculography. Following the Aortic Valve Pullback, the catheter was removed to be out of the body over.  TR Band: 1300 Hours, 15 mL air  ANESTHESIA: Local Lidocaine 2 ml  SEDATION: 1 mg IV Versed, 100 mcg IV fentanyl ; Premedication: 5 mg by mouth Valium  MEDICATIONS:  Anticoagulation:  for radial cath: IV Heparin 4000 units; for PCI: Angiomax bolus and drip throughout PCI  Anti-Platelet Agent: Effient 60 mg by mouth  EBL: < 10 ml  PATIENT DISPOSITION:  The patient was transferred to the PACU holding area in a hemodynamicaly stable, chest pain free condition.  The patient tolerated the procedure well, and there were no complications.  The patient was stable before, during, and after the procedure. POST-OPERATIVE DIAGNOSIS:  Severe single-vessel CAD of with a 100% thrombotic occlusion of the mid LAD followed by a 70-75% hazy lesion  Moderate disease in the Right Coronary Artery system  Moderate  hypokinesis of the mid to Apical Anterior and Anterolateral wall with mild to moderately reduced EF of 40-45%  PLAN OF CARE:  Monitor overnight in the CCU with standard post-radial cath care.  Convert antiplatelet agent from Plavix to Effient - with a plan to continue dual antiplatelet for minimum 1 year uninterrupted, would likely continue indefinitely due to extensive at stented segment in the LAD.  Continue to to titrate up beta blocker dose and restart ACE inhibitor in the morning.  Anticipate transfer for tomorrow and discharge on 7/17.  Outpatient Echocardiogram to reassess EF.  She will follow-up with Dr. Rennis Golden at the [facility] [place] office. Marykay Lex, M.D., M.S.   Treatments: See above  Discharge Exam: Blood pressure 127/81, pulse 68, temperature 98.4 F (36.9 C), temperature source Oral, resp. rate 20, height 5\' 7"  (1.702 m), weight 98.5 kg (217 lb 2.5 oz), SpO2 96.00%.   Disposition: 01-Home or Self Care  Discharge Orders    Future Orders Please Complete By Expires   Amb Referral to Cardiac Rehabilitation      Diet - low sodium heart healthy      Increase activity slowly      Discharge instructions      Comments:   No lifting more than a gallon of milk or driving for three days.     Medication List  As of 07/20/2012  4:17 PM   STOP taking these medications         benazepril-hydrochlorthiazide 20-25 MG per tablet         TAKE these medications         aspirin 81 MG chewable tablet   Chew 1 tablet (81 mg total) by mouth daily.      diazepam 5 MG tablet   Commonly known as: VALIUM   Take 5 mg by mouth at bedtime.      HYDROcodone-acetaminophen 7.5-325 MG per tablet   Commonly known as: NORCO   Take 1 tablet by mouth every 6 (six) hours as needed. pain      lisinopril 10 MG tablet   Commonly known as: PRINIVIL,ZESTRIL   Take 1 tablet (10 mg total) by mouth daily.      metoprolol tartrate 25 MG tablet   Commonly known  as: LOPRESSOR   Take 1 tablet (25 mg total) by mouth 2 (two) times daily.      nitroGLYCERIN 0.4 MG SL tablet   Commonly known as: NITROSTAT   Place 1 tablet (0.4 mg total) under the tongue every 5 (five) minutes x 3 doses as needed for chest pain.      prasugrel 10 MG Tabs   Commonly known as: EFFIENT   Take 1 tablet (10 mg total) by mouth daily.      rosuvastatin 5 MG tablet   Commonly known as: CRESTOR   Take 1 tablet (5 mg  total) by mouth daily at 6 PM.            Signed: HAGER, BRYAN 07/20/2012, 4:17 PM  The patient was seen & evaluated on the day of discharge by my partner, Dr. Royann Shivers.   She was doing well day #2 s/p PCI of her occluded LAD.  She underwent extensive smoking cessation and lifestyle modification counseling. > 30 min with MD on the day of d.c.  She was stable for discharge with f/u as noted above.   On ASA/Effient along with ACE-I & BB, statin.  Marykay Lex, M.D., M.S. [facility] [address] [place], [place]  [address]  [phone] Pager # [phone]  08/03/2012 7:27 AM  '

## 2012-09-01 ENCOUNTER — Encounter (HOSPITAL_COMMUNITY): Payer: BC Managed Care – PPO

## 2012-11-10 ENCOUNTER — Encounter (HOSPITAL_COMMUNITY)
Admission: RE | Admit: 2012-11-10 | Discharge: 2012-11-10 | Disposition: A | Discharge status: Home / Self Care | Payer: BC Managed Care – PPO | Source: Ambulatory Visit | Attending: Cardiology | Admitting: Cardiology

## 2012-11-10 ENCOUNTER — Encounter (HOSPITAL_COMMUNITY): Payer: Self-pay

## 2012-11-10 VITALS — BP 132/92 | HR 57 | Ht 67.0 in | Wt 219.4 lb

## 2012-11-10 DIAGNOSIS — I214 Non-ST elevation (NSTEMI) myocardial infarction: Secondary | ICD-10-CM

## 2012-11-10 DIAGNOSIS — Z5189 Encounter for other specified aftercare: Secondary | ICD-10-CM | POA: Insufficient documentation

## 2012-11-10 DIAGNOSIS — I252 Old myocardial infarction: Secondary | ICD-10-CM | POA: Insufficient documentation

## 2012-11-10 HISTORY — DX: Atherosclerotic heart disease of native coronary artery without angina pectoris: I25.10

## 2012-11-10 NOTE — Patient Instructions (Signed)
Pt has finished orientation and is scheduled to start CR on 11/16/12 at 8:15. Pt has been instructed to arrive to class 15 minutes early for scheduled class. Pt has been instructed to wear comfortable clothing and shoes with rubber soles. Pt has been told to take their medications 1 hour prior to coming to class.  If the patient is not going to attend class, he/she has been instructed to call.

## 2012-11-10 NOTE — Progress Notes (Addendum)
Patient referred to Cardiac Rehab by Dr. Bryan Lemma due to NSTEMI 410.70. During orientation advised patient on arrival and appointment times what to wear, what to do before, during and after exercise. Reviewed attendance and class policy. Talked about inclement weather and class consultation policy. Pt is scheduled to start Cardiac Rehab on 11/16/12 at 8:15am. Pt was advised to come to class 5 minutes before class starts. He was also given instructions on meeting with the dietician and attending the Family Structure classes. Pt is eager to get started.   Patient did the 6 minute walk test on 11/10/12 in Cardiac Rehab.

## 2012-11-19 ENCOUNTER — Other Ambulatory Visit: Payer: Self-pay | Admitting: Internal Medicine

## 2012-11-20 ENCOUNTER — Encounter (HOSPITAL_COMMUNITY): Payer: Self-pay | Admitting: Cardiology

## 2012-11-20 ENCOUNTER — Ambulatory Visit (HOSPITAL_COMMUNITY)
Admission: RE | Admit: 2012-11-20 | Discharge: 2012-11-20 | Disposition: A | Discharge status: Home / Self Care | Payer: BC Managed Care – PPO | Source: Ambulatory Visit | Attending: Cardiology | Admitting: Cardiology

## 2012-11-20 ENCOUNTER — Encounter (HOSPITAL_COMMUNITY): Payer: Self-pay | Admitting: Pharmacy Technician

## 2012-11-20 ENCOUNTER — Encounter (HOSPITAL_COMMUNITY)
Admission: RE | Disposition: A | Discharge status: Home / Self Care | Payer: Self-pay | Source: Ambulatory Visit | Attending: Cardiology

## 2012-11-20 DIAGNOSIS — Z9889 Other specified postprocedural states: Secondary | ICD-10-CM

## 2012-11-20 DIAGNOSIS — I252 Old myocardial infarction: Secondary | ICD-10-CM | POA: Insufficient documentation

## 2012-11-20 DIAGNOSIS — Z955 Presence of coronary angioplasty implant and graft: Secondary | ICD-10-CM

## 2012-11-20 DIAGNOSIS — Z79899 Other long term (current) drug therapy: Secondary | ICD-10-CM | POA: Insufficient documentation

## 2012-11-20 DIAGNOSIS — Z9861 Coronary angioplasty status: Secondary | ICD-10-CM | POA: Insufficient documentation

## 2012-11-20 DIAGNOSIS — R079 Chest pain, unspecified: Secondary | ICD-10-CM | POA: Insufficient documentation

## 2012-11-20 DIAGNOSIS — R9439 Abnormal result of other cardiovascular function study: Secondary | ICD-10-CM | POA: Diagnosis present

## 2012-11-20 DIAGNOSIS — Z720 Tobacco use: Secondary | ICD-10-CM | POA: Diagnosis present

## 2012-11-20 DIAGNOSIS — Z8249 Family history of ischemic heart disease and other diseases of the circulatory system: Secondary | ICD-10-CM

## 2012-11-20 DIAGNOSIS — I2510 Atherosclerotic heart disease of native coronary artery without angina pectoris: Secondary | ICD-10-CM | POA: Insufficient documentation

## 2012-11-20 DIAGNOSIS — I209 Angina pectoris, unspecified: Secondary | ICD-10-CM | POA: Diagnosis present

## 2012-11-20 DIAGNOSIS — I10 Essential (primary) hypertension: Secondary | ICD-10-CM | POA: Diagnosis present

## 2012-11-20 DIAGNOSIS — Z7902 Long term (current) use of antithrombotics/antiplatelets: Secondary | ICD-10-CM | POA: Insufficient documentation

## 2012-11-20 DIAGNOSIS — I2589 Other forms of chronic ischemic heart disease: Secondary | ICD-10-CM | POA: Insufficient documentation

## 2012-11-20 HISTORY — PX: LEFT HEART CATHETERIZATION WITH CORONARY ANGIOGRAM: SHX5451

## 2012-11-20 HISTORY — DX: Ischemic cardiomyopathy: I25.5

## 2012-11-20 SURGERY — LEFT HEART CATHETERIZATION WITH CORONARY ANGIOGRAM
Anesthesia: LOCAL

## 2012-11-20 MED ORDER — ASPIRIN 81 MG PO CHEW
CHEWABLE_TABLET | ORAL | Status: AC
  Administered 2012-11-20: 324 mg via ORAL
  Filled 2012-11-20: qty 4

## 2012-11-20 MED ORDER — VERAPAMIL HCL 2.5 MG/ML IV SOLN
INTRAVENOUS | Status: AC
  Filled 2012-11-20: qty 2

## 2012-11-20 MED ORDER — SODIUM CHLORIDE 0.9 % IJ SOLN: 3.0000 mL | INTRAMUSCULAR | Status: DC | PRN

## 2012-11-20 MED ORDER — HEPARIN SODIUM (PORCINE) 1000 UNIT/ML IJ SOLN
INTRAMUSCULAR | Status: AC
  Filled 2012-11-20: qty 1

## 2012-11-20 MED ORDER — FENTANYL CITRATE 0.05 MG/ML IJ SOLN
INTRAMUSCULAR | Status: AC
  Filled 2012-11-20: qty 2

## 2012-11-20 MED ORDER — SODIUM CHLORIDE 0.9 % IV SOLN: 250.0000 mL | INTRAVENOUS | Status: DC | PRN

## 2012-11-20 MED ORDER — NITROGLYCERIN 0.2 MG/ML ON CALL CATH LAB
INTRAVENOUS | Status: AC
  Filled 2012-11-20: qty 1

## 2012-11-20 MED ORDER — ASPIRIN 81 MG PO CHEW
324.0000 mg | CHEWABLE_TABLET | ORAL | Status: AC
  Administered 2012-11-20: 324 mg via ORAL

## 2012-11-20 MED ORDER — SODIUM CHLORIDE 0.9 % IJ SOLN: 3.0000 mL | Freq: Two times a day (BID) | INTRAMUSCULAR | Status: DC

## 2012-11-20 MED ORDER — MIDAZOLAM HCL 2 MG/2ML IJ SOLN
INTRAMUSCULAR | Status: AC
  Filled 2012-11-20: qty 2

## 2012-11-20 MED ORDER — SODIUM CHLORIDE 0.9 % IV SOLN
INTRAVENOUS | Status: DC
  Administered 2012-11-20: 09:00:00 via INTRAVENOUS

## 2012-11-20 MED ORDER — LIDOCAINE HCL (PF) 1 % IJ SOLN
INTRAMUSCULAR | Status: AC
  Filled 2012-11-20: qty 30

## 2012-11-20 MED ORDER — HEPARIN (PORCINE) IN NACL 2-0.9 UNIT/ML-% IJ SOLN
INTRAMUSCULAR | Status: AC
  Filled 2012-11-20: qty 1500

## 2012-11-20 NOTE — CV Procedure (Addendum)
[facility] CARDIAC CATHETERIZATION REPORT  NAME:  NECOLE MINASSIAN   MRN: [id number] DOB:  1956/08/16   ADMIT DATE: 11/20/2012  INTERVENTIONAL CARDIOLOGIST: Marykay Lex, M.D., MS PRIMARY CARE PROVIDER: Alice Reichert, MD PRIMARY CARDIOLOGIST: Thurmon Fair, MD or Hilty, K Chandler ([place]), MD Procedure Date:  11/20/2012  PATIENT:  [name] is a 56 y.o. female with a history of NSTEMI but 100% occluded LAD treated with 2 overlapping Xience Expedition DES stents (2.5 mm x 28 mm and 3.0 mm x 18 mm).  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  She had a Myoview ST that demonstrated High Risk features with moderate sized reversible defect in the apical anterior, apical septal & septal walls. She is referred for invasive coronary evaluation with cardiac catheterization & possible PCI.  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 Right Radial Artery Access  Left Ventriculography (RAO)  PROCEDURE: Consent: Risks of procedure as well as the alternatives and risks of each were explained to the (patient/caregiver).  Consent for procedure obtained. Consent for signed by MD and patient with RN witness -- placed on chart.  PROCEDURE: The patient was brought to the 2nd Floor [facility] in the fasting state and prepped and draped in the usual sterile fashion for Right radial access after a modified Allen's test with plethysmography demonstrated excellent ulnar artery collateral flow. Sterile technique was used including antiseptics, cap, gloves, gown, hand hygiene, mask and sheet.  Skin  prep: Chlorhexidine.  Time Out: Verified patient identification, verified procedure, site/side was marked, verified correct patient position, special equipment/implants available, medications/allergies/relevent history reviewed, required imaging and test results available.  Performed  Access: Right Radial Artery; 5 Fr Glide Sheath; Seldinger Technique, Angiocath Midcropuncture Kit. Diagnostic:  TIG 4.0 advanced over Versicore wire. Exchanged over [employer] Safety J-wire to Angled Pigtail.  Left and Right Coronary Artery Angiography:  5 Fr TIG 4.0  LV Hemodynamics (LV Gram): 5 Fr Angled Pigtail catheter  TR Band:  1105 Hours, 14 mL air - non-occlusive hemostasis.  ANESTHESIA:   Local Lidocaine 2 ml, 100 mcg Subcutaneous NTG SEDATION:  2 mg IV Versed, 25 mcg IV fentanyl ; Premedication: 5 mg PO Valium MEDICATIONS: Radial Cocktail: 5 mg Verapamil, 400 mcg NTG, 2 ml 2% Lidocaine in 10 ml NS Anticoagulation: 4500 Units IV Heparin  Anti-Platelet Agent: Effient 10mg , ASA 81 mg.  Hemodynamics:  Central Aortic / Mean Pressures: 124/74 mmHg; 94 mmHg  Left Ventricular Pressures / EDP: 12/6 mmHg; 20 mmHg  Left Ventriculography:  EF: ~55-60 %  Wall Motion: normal  Coronary Anatomy:  Left Main: Large caliber, very long tortuous vessel that gives off the LAD as well as a nondominant Left Circumflex and what looks to be Ramus Intermedius; minimal luminal irregularities.  LAD: Large caliber vessel very tortuous vessel with widely patent ove and overlapping DES stents. The vessel then courses down around the apex distally with minimal luminal irregularities.  Diagonal 1: Moderate to small (2.2 mm) vessel jailed by the stents that is widely patent s/p PTCA during the original PCI.  < 20% residual stenosis.  Tortuous vessel with no  other notable i andrregularities. Diagonal 2: Small caliber vessel distal to the stent.  Minimal luminal irregularities.  Left Circumflex: nondominant, moderate  caliber vessel that courses as a inferolateral marginal branch that is very tortuous and its course. It does give off a small AV groove branch distally. Minimal luminal irregularities.   Ramus intermedius: moderate to large caliber vessel the branches in the mid vessel giving off 2 equally sized branches covering most of the anterolateral wall; minimal luminal irregularities.  RCA: Large-caliber, almost ectatic appearing, dominant vessel with a severe "Shepherd's Crook bend"; there is a roughly 40-50% lesion in the mid vessel. The vessel bifurcates distally into The Right Posterior AV Groove Branch and Right Posterior Descending Artery (RPDA)  RPDA: large caliber, extensive vessel reaching nearly to the apex; diffuse luminal irregularities  RPL Sysytem:The Right Posterior AV Groove Branch has a proximal 40-50% lesion before gives off a small posterior lateral branch, and then gives off the AV nodal artery and a very tortuous course before bifurcating into 2 terminal moderate caliber right posterior lateral branches with diffuse luminal irregularities.  EBL:   < 10 ml  PATIENT DISPOSITION:    The patient was transferred to the PACU holding area in a hemodynamicaly stable, chest pain free condition.  The patient tolerated the procedure well, and there were no complications.  The patient was stable before, during, and after the procedure.  POST-OPERATIVE DIAGNOSIS:    Widely patent LAD stents as well as <20% ostial stenosis in the jailed D1.  Improved LV Function - now ~55%, up from 40-45% post MI.  Moderately elevated LVEDP  PLAN OF CARE:  Post radial care with d/c post bedrest.  ROV with Dr. Hilty/Croitoru as scheduled   Marykay Lex, M.D., M.S. [facility] [address] [place], [place]  [address]  [phone]  11/20/2012 10:18 AM

## 2012-11-20 NOTE — H&P (Signed)
History and Physical Interval Note:  NAME:  [name]   MRN: [id number] DOB:  [birth year]   ADMIT DATE: 11/20/2012   11/20/2012 8:51 AM  [name] is a 56 y.o. female with history of non-ST-elevation MI in July this year (intervention noted below).  Per Dr. Rennis Golden: Unfortunately, there was a diagonal artery that was jailed and this was rescued at the time with angioplasty. She did well post procedure, went back to work; however, required a lot of physical activity. She was having chest fullness, shortness of breath which worsened with exertion and improved with nitroglycerin. She saw my partner, Dr. Royann Shivers, who started her on a long-acting nitrate and was still having anginal symptoms; however, they have improved. She underwent a nuclear stress test in our office on November 13, 2012, which showed significant reversible anterior ischemia with a combination of exercise and Lexiscan. The ischemia was in the mid anterior, mid anteroseptal, apical anterior, apical septal and septal walls which was a moderate-sized defect. Sum difference score was 6 and there was an anterior hypokinesis. This was interpreted as a high-risk study. Her ECG also showed 1-1.5-mm ST depression at peak stress in the inferior and lateral leads. Today, she reports being chest pain free; however, she has not been as active as she had been in the past. She reports that the long-acting nitroglycerin is helping her with her symptoms and of course this is concerning for crescendo angina. It would be unusual for in-stent restenosis; however, could be a problem with either the diagonal artery or perhaps at the bifurcation distal to the proximal stent. I discussed management with the patient today and I feel that given the size of the defect and her anginal symptoms, although improved, and the strenuousness of her job that she would benefit from repeat angiogram. If there is significant stenosis to explain that reversibility then  intervention would be necessary. If the angiogram does not show any significant stenoses or vessels that are not amenable to PCI then I would recommend up titrating her Imdur and will work on rehabilitation and medical management. Will plan to do her catheterization hopefully later this week.   Past Medical History  Diagnosis Date  . Hypertension   . Coronary artery disease 06/2012    s/p NSTEMI with 100% LAD occlusion -- 2 Overlappin Xience DES Stents (2.5 mm x 28, 3.0 mm 18 mm)  . Ischemic cardiomyopathy 06/2012    EF ~40-45%   Past Surgical History  Procedure Date  . Knee surgery   . Cardiac catheterization   . Coronary stent placement 07/15/12    FAMHx: Family History  Problem Relation Age of Onset  . Hypertension Mother   . Hypertension Father   . Hypertension Sister   . Hypertension Brother   . Coronary artery disease Brother 30    SOCHx:  reports that she has been smoking.  She does not have any smokeless tobacco history on file. She reports that she does not drink alcohol. Her drug history not on file.  ALLERGIES: Allergies  Allergen Reactions  . Lipitor (Atorvastatin) Other (See Comments)    Severe bone aches    HOME MEDICATIONS: Prescriptions prior to admission  Medication Sig Dispense Refill  . aspirin 81 MG chewable tablet Chew 1 tablet (81 mg total) by mouth daily.      . benazepril-hydrochlorthiazide (LOTENSIN HCT) 20-25 MG per tablet Take 1 tablet by mouth daily.      . diazepam (VALIUM) 5 MG tablet Take  5 mg by mouth at bedtime.      Marland Kitchen HYDROcodone-acetaminophen (NORCO) 7.5-325 MG per tablet Take 1 tablet by mouth every 6 (six) hours as needed. For pain.      Marland Kitchen lisinopril (PRINIVIL,ZESTRIL) 10 MG tablet Take 1 tablet (10 mg total) by mouth daily.  30 tablet  5  . nitroGLYCERIN (NITROSTAT) 0.4 MG SL tablet Place 0.4 mg under the tongue every 5 (five) minutes as needed. For chest pain.      . prasugrel (EFFIENT) 10 MG TABS Take 1 tablet (10 mg total) by mouth  daily.  30 tablet  10  . rosuvastatin (CRESTOR) 5 MG tablet Take 1 tablet (5 mg total) by mouth daily at 6 PM.  30 tablet  5    PHYSICAL EXAM:Blood pressure 146/92, pulse 56, temperature 97 F (36.1 C), temperature source Oral, resp. rate 18, height 5\' 7"  (1.702 m), weight 89.812 kg (198 lb), SpO2 100.00%. General appearance: alert, cooperative, appears stated age, no distress and normal mood & affect Neck: no adenopathy, no carotid bruit, no JVD and supple, symmetrical, trachea midline Lungs: clear to auscultation bilaterally, normal percussion bilaterally and non-labored Heart: regular rate and rhythm, S1, S2 normal, no murmur, click, rub or gallop Abdomen: soft, non-tender; bowel sounds normal; no masses,  no organomegaly Extremities: extremities normal, atraumatic, no cyanosis or edema Pulses: 2+ and symmetric Neurologic: Mental status: Alert, oriented, thought content appropriate Cranial nerves: normal  IMPRESSION & PLAN The patients' history has been reviewed, patient examined, no change in status from most recent note, stable for surgery. I have reviewed the patients' chart and labs. Questions were answered to the patient's satisfaction.    [name] has presented today for surgery, with the diagnosis of chest pain (Class II-II Angina) with HIGH RISK Anterior Ischemia on Nuclear Stress Test. The various methods of treatment have been discussed with the patient and family.   Risks / Complications include, but not limited to: Death, MI, CVA/TIA, VF/VT (with defibrillation), Bradycardia (need for temporary pacer placement), contrast induced nephropathy, bleeding / bruising / hematoma / pseudoaneurysm, vascular or coronary injury (with possible emergent CT or Vascular Surgery), adverse medication reactions, infection.     After consideration of risks, benefits and other options for treatment, the patient has consented to Procedure(s):  LEFT HEART CATHETERIZATION AND CORONARY  ANGIOGRAPHY +/- AD HOC PERCUTANEOUS CORONARY INTERVENTION  as a surgical intervention.   We will proceed with the planned procedure.   Joelle Flessner W [facility] [address] [place], [place]  [address]  [phone]  11/20/2012 8:51 AM

## 2012-11-25 ENCOUNTER — Encounter (HOSPITAL_COMMUNITY)
Admission: RE | Admit: 2012-11-25 | Discharge: 2012-11-25 | Disposition: A | Discharge status: Home / Self Care | Payer: BC Managed Care – PPO | Source: Ambulatory Visit | Attending: Cardiology | Admitting: Cardiology

## 2012-11-27 ENCOUNTER — Encounter (HOSPITAL_COMMUNITY): Payer: BC Managed Care – PPO

## 2012-11-30 ENCOUNTER — Encounter (HOSPITAL_COMMUNITY)
Admission: RE | Admit: 2012-11-30 | Discharge: 2012-11-30 | Disposition: A | Discharge status: Home / Self Care | Payer: BC Managed Care – PPO | Source: Ambulatory Visit | Attending: Cardiology | Admitting: Cardiology

## 2012-11-30 DIAGNOSIS — Z5189 Encounter for other specified aftercare: Secondary | ICD-10-CM | POA: Insufficient documentation

## 2012-11-30 DIAGNOSIS — I252 Old myocardial infarction: Secondary | ICD-10-CM | POA: Insufficient documentation

## 2012-12-02 ENCOUNTER — Encounter (HOSPITAL_COMMUNITY)
Admission: RE | Admit: 2012-12-02 | Discharge: 2012-12-02 | Disposition: A | Discharge status: Home / Self Care | Payer: BC Managed Care – PPO | Source: Ambulatory Visit | Attending: Cardiology | Admitting: Cardiology

## 2012-12-04 ENCOUNTER — Encounter (HOSPITAL_COMMUNITY)
Admission: RE | Admit: 2012-12-04 | Discharge: 2012-12-04 | Disposition: A | Discharge status: Home / Self Care | Payer: BC Managed Care – PPO | Source: Ambulatory Visit | Attending: Cardiology | Admitting: Cardiology

## 2012-12-07 ENCOUNTER — Encounter (HOSPITAL_COMMUNITY)
Admission: RE | Admit: 2012-12-07 | Discharge: 2012-12-07 | Disposition: A | Discharge status: Home / Self Care | Payer: BC Managed Care – PPO | Source: Ambulatory Visit | Attending: Cardiology | Admitting: Cardiology

## 2012-12-09 ENCOUNTER — Encounter (HOSPITAL_COMMUNITY)
Admission: RE | Admit: 2012-12-09 | Discharge: 2012-12-09 | Disposition: A | Discharge status: Home / Self Care | Payer: BC Managed Care – PPO | Source: Ambulatory Visit | Attending: Cardiology | Admitting: Cardiology

## 2012-12-11 ENCOUNTER — Encounter (HOSPITAL_COMMUNITY)
Admission: RE | Admit: 2012-12-11 | Discharge: 2012-12-11 | Disposition: A | Discharge status: Home / Self Care | Payer: BC Managed Care – PPO | Source: Ambulatory Visit | Attending: Cardiology | Admitting: Cardiology

## 2012-12-14 ENCOUNTER — Encounter (HOSPITAL_COMMUNITY)
Admission: RE | Admit: 2012-12-14 | Discharge: 2012-12-14 | Disposition: A | Discharge status: Home / Self Care | Payer: BC Managed Care – PPO | Source: Ambulatory Visit | Attending: Cardiology | Admitting: Cardiology

## 2012-12-16 ENCOUNTER — Encounter (HOSPITAL_COMMUNITY)
Admission: RE | Admit: 2012-12-16 | Discharge: 2012-12-16 | Disposition: A | Discharge status: Home / Self Care | Payer: BC Managed Care – PPO | Source: Ambulatory Visit | Attending: Cardiology | Admitting: Cardiology

## 2012-12-18 ENCOUNTER — Encounter (HOSPITAL_COMMUNITY): Payer: BC Managed Care – PPO

## 2012-12-21 ENCOUNTER — Encounter (HOSPITAL_COMMUNITY)
Admission: RE | Admit: 2012-12-21 | Discharge: 2012-12-21 | Disposition: A | Discharge status: Home / Self Care | Payer: BC Managed Care – PPO | Source: Ambulatory Visit | Attending: Cardiology | Admitting: Cardiology

## 2012-12-25 ENCOUNTER — Encounter (HOSPITAL_COMMUNITY)
Admission: RE | Admit: 2012-12-25 | Discharge: 2012-12-25 | Disposition: A | Discharge status: Home / Self Care | Payer: BC Managed Care – PPO | Source: Ambulatory Visit | Attending: Cardiology | Admitting: Cardiology

## 2012-12-28 ENCOUNTER — Encounter (HOSPITAL_COMMUNITY): Payer: BC Managed Care – PPO

## 2012-12-30 ENCOUNTER — Encounter (HOSPITAL_COMMUNITY): Payer: BC Managed Care – PPO

## 2013-01-01 ENCOUNTER — Encounter (HOSPITAL_COMMUNITY): Payer: BC Managed Care – PPO

## 2013-01-04 ENCOUNTER — Encounter (HOSPITAL_COMMUNITY): Payer: BC Managed Care – PPO

## 2013-01-06 ENCOUNTER — Encounter (HOSPITAL_COMMUNITY): Payer: BC Managed Care – PPO

## 2013-01-08 ENCOUNTER — Encounter (HOSPITAL_COMMUNITY): Payer: BC Managed Care – PPO

## 2013-01-11 ENCOUNTER — Encounter (HOSPITAL_COMMUNITY): Payer: BC Managed Care – PPO

## 2013-01-13 ENCOUNTER — Encounter (HOSPITAL_COMMUNITY): Payer: BC Managed Care – PPO

## 2013-01-15 ENCOUNTER — Encounter (HOSPITAL_COMMUNITY): Payer: BC Managed Care – PPO

## 2013-01-18 ENCOUNTER — Encounter (HOSPITAL_COMMUNITY): Payer: BC Managed Care – PPO

## 2013-01-20 ENCOUNTER — Encounter (HOSPITAL_COMMUNITY): Payer: BC Managed Care – PPO

## 2013-01-22 ENCOUNTER — Encounter (HOSPITAL_COMMUNITY): Payer: BC Managed Care – PPO

## 2013-01-25 ENCOUNTER — Encounter (HOSPITAL_COMMUNITY): Payer: BC Managed Care – PPO

## 2013-01-27 ENCOUNTER — Encounter (HOSPITAL_COMMUNITY): Payer: BC Managed Care – PPO

## 2013-01-29 ENCOUNTER — Encounter (HOSPITAL_COMMUNITY): Payer: BC Managed Care – PPO

## 2013-02-01 ENCOUNTER — Encounter (HOSPITAL_COMMUNITY): Payer: BC Managed Care – PPO

## 2013-02-03 ENCOUNTER — Encounter (HOSPITAL_COMMUNITY): Payer: BC Managed Care – PPO

## 2013-02-05 ENCOUNTER — Encounter (HOSPITAL_COMMUNITY): Payer: BC Managed Care – PPO

## 2013-02-08 ENCOUNTER — Encounter (HOSPITAL_COMMUNITY): Payer: BC Managed Care – PPO

## 2013-02-10 ENCOUNTER — Encounter (HOSPITAL_COMMUNITY): Payer: BC Managed Care – PPO

## 2013-02-12 ENCOUNTER — Encounter (HOSPITAL_COMMUNITY): Payer: BC Managed Care – PPO

## 2013-02-15 ENCOUNTER — Encounter (HOSPITAL_COMMUNITY): Payer: BC Managed Care – PPO

## 2013-03-15 NOTE — Progress Notes (Signed)
Cardiac Rehabilitation Program Outcomes Report   Orientation:  11/10/2012 !st Week Report:12/02/2012 Graduate Date:  tbd Discharge Date:  tbd # of sessions completed: 3  Cardiologist: Hilty and Pulmonologist: Error Family MD:  Dr. Renard Matter Class Time:  08:15  A.  Exercise Program:  Tolerates exercise @ 3.69 METS for 15 minutes and Walk Test Results:  Pre: Pre walk test HR 53, BP 148/90, O2 98%, RPE 7, and RPD 7. 6 minute HR 82, BP 170/100, O2 97, RPE 9 and RPD 9. Post HR 52, BP 130/98, O2 100, RPE 7 and RPD 7. Walked 1300 ft at 2.55 mph, 2.9 METS.  B.  Mental Health:  Good mental attitude  C.  Education/Instruction/Skills  Accurately checks own pulse.  Rest:  56  Exercise:  85, Knows THR for exercise and Uses Perceived Exertion Scale and/or Dyspnea Scale  Uses Perceived Exertion Scale and/or Dyspnea Scale  D.  Nutrition/Weight Control/Body Composition:  Adherence to prescribed nutrition program: good    E.  Blood Lipids    Lab Results  Component Value Date   CHOL 165 07/13/2012   HDL 68 07/13/2012   LDLCALC 88 07/13/2012   TRIG 43 07/13/2012   CHOLHDL 2.4 07/13/2012    F.  Lifestyle Changes:  Making positive lifestyle changes  G.  Symptoms noted with exercise:  Asymptomatic  Report Completed By:  Lelon Huh. Liliane Mallis RN   Comments:  This is patients 1st week report . She achieved a peak  METS of 3.69. Her resting Hr was 56 and resting BP was 122/80, Her peak HR was 85 and her peak BP was 140/80. She has done well in her 1st week.A report will follow on her [place] visit.

## 2013-03-15 NOTE — Progress Notes (Signed)
Cardiac Rehabilitation Program Outcomes Report   Orientation:  11/10/2012 Graduate Date:  NA Discharge Date:  12/25/2012 # of sessions completed: 11  Cardiologist: Hilty Family MD:  Fae Pippin Time:  08:15  A.  Exercise Program:  Tolerates exercise @ 4.05 METS for 15 minutes and Discharged  B.  Mental Health:  Good mental attitude  C.  Education/Instruction/Skills  Accurately checks own pulse.  Rest:  78  Exercise:  94, Knows THR for exercise, Uses Perceived Exertion Scale and/or Dyspnea Scale and Attended 4 education classes  Uses Perceived Exertion Scale and/or Dyspnea Scale  D.  Nutrition/Weight Control/Body Composition:  Adherence to prescribed nutrition program: good    E.  Blood Lipids    Lab Results  Component Value Date   CHOL 165 07/13/2012   HDL 68 07/13/2012   LDLCALC 88 07/13/2012   TRIG 43 07/13/2012   CHOLHDL 2.4 07/13/2012    F.  Lifestyle Changes:  Making positive lifestyle changes  G.  Symptoms noted with exercise:  Asymptomatic  Report Completed By:  Lelon Huh. Albana Saperstein RN   Comments:  This is patients 11th visit. She did not finish the program because of Insurance problems. Her last day was 12/25/2012. She achieved a Paek METS of 4.05. Her resting HR was 78 and resting BP was 132/80. Her peak HR was 94 and her peak BP was 140/80. She did very well while in rehab.

## 2013-03-23 NOTE — Progress Notes (Signed)
Patient stopped coming to Cardiac Rehabilitation today on 12/25/12 after completing 11 sessions. She did not get a chance to complete any of her exit test. Contacted patient and she informed us that she is now continuing her exercise routine at the [facility]. Patient had no complaints of any abnormal S/S or pain on their exit visit.

## 2013-03-23 NOTE — Patient Instructions (Signed)
Pt has stopped the Cardiac Rehab program. She has been instructed to let us know if she would like to return to give [place] a call.

## 2013-06-15 ENCOUNTER — Telehealth: Payer: Self-pay | Admitting: Cardiology

## 2013-06-15 NOTE — Telephone Encounter (Signed)
Need some samples of Effient 10mg  please!

## 2013-06-16 NOTE — Telephone Encounter (Signed)
Samples left at front desk for pt pick up.  Lot: [id number]; [id number]  Exp: 05/2014; 07/2014.  Unable to reach pt.

## 2013-06-26 ENCOUNTER — Other Ambulatory Visit: Payer: Self-pay | Admitting: Internal Medicine

## 2013-06-28 NOTE — Telephone Encounter (Signed)
Rx was printed for [facility] to sign and will be faxed once [facility] signs off.

## 2013-08-07 IMAGING — CR DG CHEST 1V PORT
1 series · 1 of 1 positions shown · non-contrast
Comparison: 01/16/2011

CLINICAL DATA: Chest pain, shortness of breath

PORTABLE CHEST - 1 VIEW

[view not recorded]
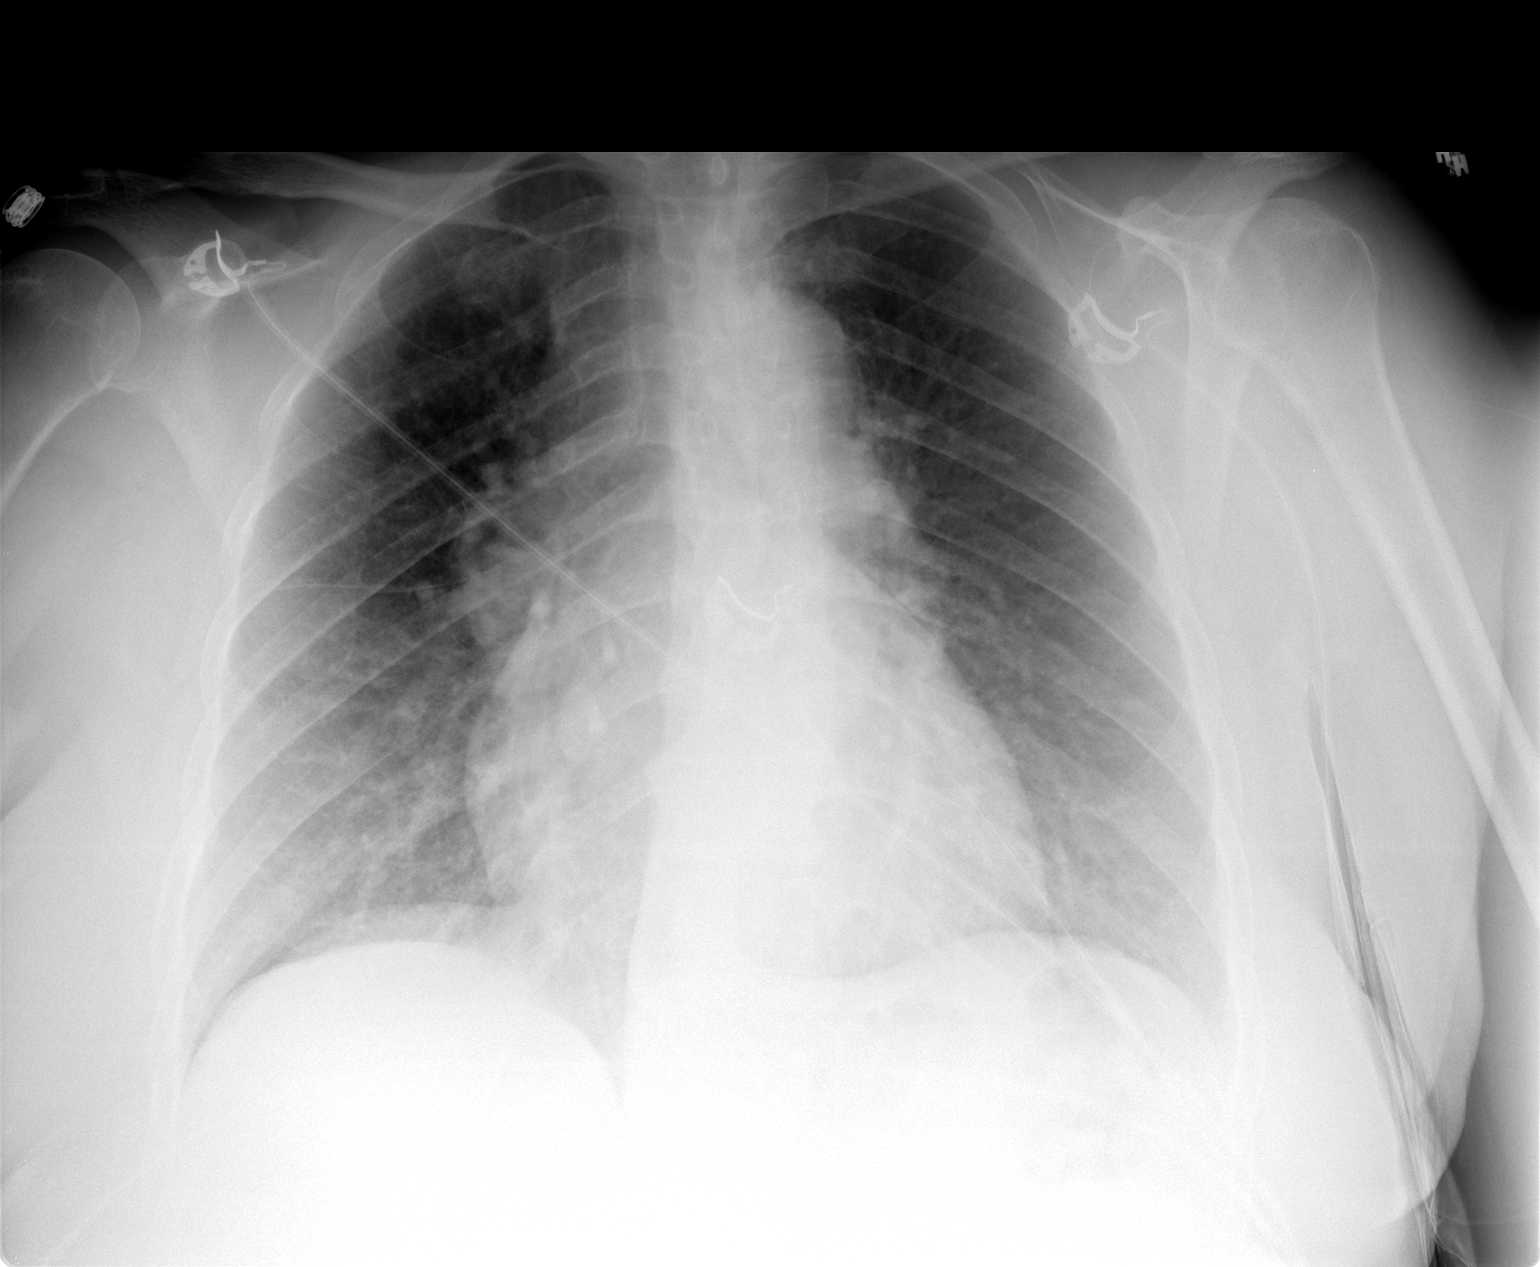

[1 of 1 positions shown; findings below may reference images not displayed]

FINDINGS: Mild enlargement cardiomediastinal silhouette is noted.
Fine detail is obscured by patient body habitus.  The lungs are
clear.  No pleural effusion.  No acute osseous abnormality.
IMPRESSION: Mild cardiomegaly without focal acute finding.

## 2013-11-26 ENCOUNTER — Other Ambulatory Visit: Payer: Self-pay | Admitting: Internal Medicine

## 2013-11-29 NOTE — Telephone Encounter (Signed)
Rx was sent to pharmacy electronically. 

## 2013-12-02 ENCOUNTER — Other Ambulatory Visit: Payer: Self-pay

## 2013-12-02 MED ORDER — PRASUGREL HCL 10 MG PO TABS: ORAL_TABLET | ORAL | Status: DC

## 2013-12-02 NOTE — Telephone Encounter (Signed)
Rx was sent to pharmacy electronically. 

## 2013-12-03 ENCOUNTER — Other Ambulatory Visit: Payer: Self-pay | Admitting: *Deleted

## 2013-12-03 MED ORDER — NITROGLYCERIN 0.4 MG SL SUBL: 0.4000 mg | SUBLINGUAL_TABLET | SUBLINGUAL | Status: DC | PRN

## 2014-07-09 ENCOUNTER — Emergency Department (HOSPITAL_COMMUNITY)
Admission: EM | Admit: 2014-07-09 | Discharge: 2014-07-09 | Disposition: A | Discharge status: Home / Self Care | Payer: Self-pay | Attending: Emergency Medicine | Admitting: Emergency Medicine

## 2014-07-09 ENCOUNTER — Emergency Department (HOSPITAL_COMMUNITY): Payer: Self-pay

## 2014-07-09 ENCOUNTER — Encounter (HOSPITAL_COMMUNITY): Payer: Self-pay | Admitting: Emergency Medicine

## 2014-07-09 DIAGNOSIS — Z791 Long term (current) use of non-steroidal anti-inflammatories (NSAID): Secondary | ICD-10-CM | POA: Insufficient documentation

## 2014-07-09 DIAGNOSIS — Y9289 Other specified places as the place of occurrence of the external cause: Secondary | ICD-10-CM | POA: Insufficient documentation

## 2014-07-09 DIAGNOSIS — S76011A Strain of muscle, fascia and tendon of right hip, initial encounter: Secondary | ICD-10-CM

## 2014-07-09 DIAGNOSIS — IMO0002 Reserved for concepts with insufficient information to code with codable children: Secondary | ICD-10-CM | POA: Insufficient documentation

## 2014-07-09 DIAGNOSIS — Z79899 Other long term (current) drug therapy: Secondary | ICD-10-CM | POA: Insufficient documentation

## 2014-07-09 DIAGNOSIS — M2550 Pain in unspecified joint: Secondary | ICD-10-CM | POA: Insufficient documentation

## 2014-07-09 DIAGNOSIS — Z7982 Long term (current) use of aspirin: Secondary | ICD-10-CM | POA: Insufficient documentation

## 2014-07-09 DIAGNOSIS — F17200 Nicotine dependence, unspecified, uncomplicated: Secondary | ICD-10-CM | POA: Insufficient documentation

## 2014-07-09 DIAGNOSIS — I2510 Atherosclerotic heart disease of native coronary artery without angina pectoris: Secondary | ICD-10-CM | POA: Insufficient documentation

## 2014-07-09 DIAGNOSIS — Z9861 Coronary angioplasty status: Secondary | ICD-10-CM | POA: Insufficient documentation

## 2014-07-09 DIAGNOSIS — X500XXA Overexertion from strenuous movement or load, initial encounter: Secondary | ICD-10-CM | POA: Insufficient documentation

## 2014-07-09 DIAGNOSIS — Z9889 Other specified postprocedural states: Secondary | ICD-10-CM | POA: Insufficient documentation

## 2014-07-09 DIAGNOSIS — Y9301 Activity, walking, marching and hiking: Secondary | ICD-10-CM | POA: Insufficient documentation

## 2014-07-09 DIAGNOSIS — Z8673 Personal history of transient ischemic attack (TIA), and cerebral infarction without residual deficits: Secondary | ICD-10-CM | POA: Insufficient documentation

## 2014-07-09 DIAGNOSIS — I10 Essential (primary) hypertension: Secondary | ICD-10-CM | POA: Insufficient documentation

## 2014-07-09 MED ORDER — HYDROCODONE-ACETAMINOPHEN 5-325 MG PO TABS: 1.0000 | ORAL_TABLET | ORAL | Status: DC | PRN

## 2014-07-09 MED ORDER — HYDROCODONE-ACETAMINOPHEN 5-325 MG PO TABS
1.0000 | ORAL_TABLET | Freq: Once | ORAL | Status: AC
Start: 2014-07-09 — End: 2014-07-09
  Administered 2014-07-09: 1 via ORAL
  Filled 2014-07-09: qty 1

## 2014-07-09 NOTE — ED Notes (Signed)
Pt  C/o right leg pain that radiates up to right hip area that started after she "kicked" at a dog while she was walking Tuesday, pt also c/o pain to right shoulder that started Thursday, denies any injury, cms intact all extremities,

## 2014-07-09 NOTE — ED Provider Notes (Signed)
CSN: [id number]     Arrival date & time 07/09/14  1005 History   First MD Initiated Contact with Patient 07/09/14 1015     Chief Complaint  Patient presents with  . Leg Pain     (Consider location/radiation/quality/duration/timing/severity/associated sxs/prior Treatment) HPI   [name] is a 58 y.o. female presenting with with right hip and upper leg/buttock pain since she kicked at 2 dogs which were trying to approach her while walking 4 days ago.  She denies falling.  This deep achy pain is worsened when she lies on her right side or sits,  And certain positions when standing.  She has taken ibuprofen without relief.  She does have chronic right knee pain and is prescribed hydrocodone 7.5/325 by her pcp which she reports ran out of last week.  She denies increased knee pain with this new injury.      Past Medical History  Diagnosis Date  . Hypertension   . Coronary artery disease 06/2012    s/p NSTEMI with 100% LAD occlusion -- 2 Overlappin Xience DES Stents (2.5 mm x 28, 3.0 mm 18 mm)  . Ischemic cardiomyopathy 06/2012    EF ~40-45%   Past Surgical History  Procedure Laterality Date  . Knee surgery    . Cardiac catheterization    . Coronary stent placement  07/15/12   Family History  Problem Relation Age of Onset  . Hypertension Mother   . Hypertension Father   . Hypertension Sister   . Hypertension Brother   . Coronary artery disease Brother [id number]   History  Substance Use Topics  . Smoking status: Current Every Day Smoker -- 1.00 packs/day for 20 years  . Smokeless tobacco: Not on file  . Alcohol Use: No   OB History   Grav Para Term Preterm Abortions TAB SAB Ect Mult Living                 Review of Systems  Constitutional: Negative for fever.  Musculoskeletal: Positive for arthralgias. Negative for joint swelling and myalgias.  Neurological: Negative for weakness and numbness.      Allergies  Lipitor  Home Medications   Prior to Admission medications    Medication Sig Start Date End Date Taking? Authorizing Provider  Aspirin-Acetaminophen (GOODYS BODY PAIN PO) Take 1 packet by mouth daily as needed (Pain).    Yes Historical Provider, MD  benazepril-hydrochlorthiazide (LOTENSIN HCT) 20-25 MG per tablet Take 1 tablet by mouth daily.   Yes Historical Provider, MD  diazepam (VALIUM) 5 MG tablet Take 5 mg by mouth at bedtime.   Yes Historical Provider, MD  HYDROcodone-acetaminophen (NORCO) 7.5-325 MG per tablet Take 1 tablet by mouth every 6 (six) hours as needed for moderate pain.    Yes Historical Provider, MD  ibuprofen (ADVIL,MOTRIN) 200 MG tablet Take 400 mg by mouth every 6 (six) hours as needed for mild pain.   Yes Historical Provider, MD  naproxen sodium (ALEVE) 220 MG tablet Take 440 mg by mouth 2 (two) times daily as needed (Pain).   Yes Historical Provider, MD  Omega-3 Fatty Acids (FISH OIL PO) Take 1 capsule by mouth daily.   Yes Historical Provider, MD  rosuvastatin (CRESTOR) 5 MG tablet Take 5 mg by mouth daily.   Yes Historical Provider, MD  HYDROcodone-acetaminophen (NORCO/VICODIN) 5-325 MG per tablet Take 1 tablet by mouth every 4 (four) hours as needed. 07/09/14   Burgess AmorJulie Jerney Baksh, PA-C  nitroGLYCERIN (NITROSTAT) 0.4 MG SL tablet Place 1 tablet (  0.4 mg total) under the tongue every 5 (five) minutes as needed. For chest pain. 12/03/13   Marykay Lex, MD   BP 140/91  Pulse 71  Temp(Src) 97.6 F (36.4 C) (Oral)  Resp 20  Wt 215 lb (97.523 kg)  SpO2 100% Physical Exam  Constitutional: She appears well-developed and well-nourished.  HENT:  Head: Atraumatic.  Neck: Normal range of motion.  Cardiovascular:  Pulses:      Dorsalis pedis pulses are 2+ on the right side, and 2+ on the left side.  Pulses equal bilaterally  Musculoskeletal: She exhibits tenderness.  TTP right lateral hip soft tissue, but also deep posterior buttock/upper thigh.  No edema.  Pain is worsened with right hip internal rotation.  No groin pain. Right knee is  nontender.    Neurological: She is alert. She has normal strength. She displays normal reflexes. No sensory deficit.  Skin: Skin is warm and dry.  Psychiatric: She has a normal mood and affect.    ED Course  Procedures (including critical care time) Labs Review Labs Reviewed - No data to display  Imaging Review Dg Hip Complete Right  07/09/2014   CLINICAL DATA:  Right hip pain x 1 week  EXAM: RIGHT HIP - COMPLETE 2+ VIEW  COMPARISON:  None.  FINDINGS: Hips are located. No evidence of pelvic fracture or sacral fracture. Dedicated view of the right hip demonstrates no femoral neck fracture.  IMPRESSION: No evidence of pelvic fracture or right hip fracture.   Electronically Signed   By: Genevive Bi M.D.   On: 07/09/2014 11:31     EKG Interpretation None      MDM   Final diagnoses:  Hip strain, right, initial encounter    [place] controlled substance database reviewed. Pt has been receiving hydrocodone 7.5/325 by her pcp as she describes, her last prescription was obtained 06/18/14 - 10 day supply.    Burgess Amor, PA-C 07/09/14 1141

## 2014-07-09 NOTE — Discharge Instructions (Signed)
Hip Pain °The hips join the upper legs to the lower pelvis. The bones, cartilage, tendons, and muscles of the hip joint perform a lot of work each day holding your body weight and allowing you to move around. °Hip pain is a common symptom. It can range from a minor ache to severe pain on 1 or both hips. Pain may be felt on the inside of the hip joint near the groin, or the outside near the buttocks and upper thigh. There may be swelling or stiffness as well. It occurs more often when a person walks or performs activity. There are many reasons hip pain can develop. °CAUSES  °It is important to work with your caregiver to identify the cause since many conditions can impact the bones, cartilage, muscles, and tendons of the hips. Causes for hip pain include: °· Broken (fractured) bones. °· Separation of the thighbone from the hip socket (dislocation). °· Torn cartilage of the hip joint. °· Swelling (inflammation) of a tendon (tendonitis), the sac within the hip joint (bursitis), or a joint. °· A weakening in the abdominal wall (hernia), affecting the nerves to the hip. °· Arthritis in the hip joint or lining of the hip joint. °· Pinched nerves in the back, hip, or upper thigh. °· A bulging disc in the spine (herniated disc). °· Rarely, bone infection or cancer. °DIAGNOSIS  °The location of your hip pain will help your caregiver understand what may be causing the pain. A diagnosis is based on your medical history, your symptoms, results from your physical exam, and results from diagnostic tests. Diagnostic tests may include X-ray exams, a computerized magnetic scan (magnetic resonance imaging, MRI), or bone scan. °TREATMENT  °Treatment will depend on the cause of your hip pain. Treatment may include: °· Limiting activities and resting until symptoms improve. °· Crutches or other walking supports (a cane or brace). °· Ice, elevation, and compression. °· Physical therapy or home exercises. °· Shoe inserts or special  shoes. °· Losing weight. °· Medications to reduce pain. °· Undergoing surgery. °HOME CARE INSTRUCTIONS  °· Only take over-the-counter or prescription medicines for pain, discomfort, or fever as directed by your caregiver. °· Put ice on the injured area: °¨ Put ice in a plastic bag. °¨ Place a towel between your skin and the bag. °¨ Leave the ice on for 15-20 minutes at a time, 03-04 times a day. °· Keep your leg raised (elevated) when possible to lessen swelling. °· Avoid activities that cause pain. °· Follow specific exercises as directed by your caregiver. °· Sleep with a pillow between your legs on your most comfortable side. °· Record how often you have hip pain, the location of the pain, and what it feels like. This information may be helpful to you and your caregiver. °· Ask your caregiver about returning to work or sports and whether you should drive. °· Follow up with your caregiver for further exams, therapy, or testing as directed. °SEEK MEDICAL CARE IF:  °· Your pain or swelling continues or worsens after 1 week. °· You are feeling unwell or have chills. °· You have increasing difficulty with walking. °· You have a loss of sensation or other new symptoms. °· You have questions or concerns. °SEEK IMMEDIATE MEDICAL CARE IF:  °· You cannot put weight on the affected hip. °· You have fallen. °· You have a sudden increase in pain and swelling in your hip. °· You have a fever. °MAKE SURE YOU:  °· Understand these instructions. °·   Will watch your condition.  Will get help right away if you are not doing well or get worse. Document Released: 06/05/2010 Document Revised: 03/09/2012 Document Reviewed: 06/05/2010 [facility] Patient Information 2015 [place], [place]. This information is not intended to replace advice given to you by your health care provider. Make sure you discuss any questions you have with your health care provider.    Your xrays are negative for any bony injury (no fractures).  You exam is  consistent with a muscle and/or tendon strain.   Use a heating pad for 20 minutes several times daily.  You may take the hydrocodone prescribed for pain relief.  This will make you drowsy - do not drive within 4 hours of taking this medication.  Follow up with your doctor if not improved over the next week.

## 2014-07-10 NOTE — ED Provider Notes (Signed)
Medical screening examination/treatment/procedure(s) were performed by non-physician practitioner and as supervising physician I was immediately available for consultation/collaboration.   EKG Interpretation None       Donnetta HutchingBrian Sueann Brownley, MD 07/10/14 [phone]

## 2014-07-21 ENCOUNTER — Other Ambulatory Visit: Payer: Self-pay | Admitting: Internal Medicine

## 2014-07-24 ENCOUNTER — Encounter (HOSPITAL_COMMUNITY): Payer: Self-pay | Admitting: Emergency Medicine

## 2014-07-24 ENCOUNTER — Emergency Department (HOSPITAL_COMMUNITY)
Admission: EM | Admit: 2014-07-24 | Discharge: 2014-07-24 | Disposition: A | Discharge status: Home / Self Care | Payer: Self-pay | Attending: Emergency Medicine | Admitting: Emergency Medicine

## 2014-07-24 DIAGNOSIS — Z79899 Other long term (current) drug therapy: Secondary | ICD-10-CM | POA: Insufficient documentation

## 2014-07-24 DIAGNOSIS — Z9889 Other specified postprocedural states: Secondary | ICD-10-CM | POA: Insufficient documentation

## 2014-07-24 DIAGNOSIS — Z9861 Coronary angioplasty status: Secondary | ICD-10-CM | POA: Insufficient documentation

## 2014-07-24 DIAGNOSIS — M25551 Pain in right hip: Secondary | ICD-10-CM

## 2014-07-24 DIAGNOSIS — F17200 Nicotine dependence, unspecified, uncomplicated: Secondary | ICD-10-CM | POA: Insufficient documentation

## 2014-07-24 DIAGNOSIS — M545 Low back pain, unspecified: Secondary | ICD-10-CM | POA: Insufficient documentation

## 2014-07-24 DIAGNOSIS — I2510 Atherosclerotic heart disease of native coronary artery without angina pectoris: Secondary | ICD-10-CM | POA: Insufficient documentation

## 2014-07-24 DIAGNOSIS — M25559 Pain in unspecified hip: Secondary | ICD-10-CM | POA: Insufficient documentation

## 2014-07-24 DIAGNOSIS — I10 Essential (primary) hypertension: Secondary | ICD-10-CM | POA: Insufficient documentation

## 2014-07-24 DIAGNOSIS — I2589 Other forms of chronic ischemic heart disease: Secondary | ICD-10-CM | POA: Insufficient documentation

## 2014-07-24 DIAGNOSIS — Z7982 Long term (current) use of aspirin: Secondary | ICD-10-CM | POA: Insufficient documentation

## 2014-07-24 MED ORDER — MELOXICAM 7.5 MG PO TABS: 15.0000 mg | ORAL_TABLET | Freq: Every day | ORAL | Status: DC

## 2014-07-24 MED ORDER — IBUPROFEN 800 MG PO TABS
800.0000 mg | ORAL_TABLET | Freq: Once | ORAL | Status: AC
  Administered 2014-07-24: 800 mg via ORAL
  Filled 2014-07-24: qty 1

## 2014-07-24 NOTE — ED Provider Notes (Signed)
CSN: [id number]     Arrival date & time 07/24/14  [id number] History   First MD Initiated Contact with Patient 07/24/14 [phone]     Chief Complaint  Patient presents with  . Back Pain     (Consider location/radiation/quality/duration/timing/severity/associated sxs/prior Treatment) HPI Comments: Patient is a 58 yo F past medical history significant for hypertension, coronary disease, ischemic cardiomyopathy presenting to the emergency department for continued right-sided low back and hip pain with radiation into right leg. Patient describes a sore and achy. She states this began 3 weeks ago after she kicked at 2 dogs which were trying to approach her while walking. States he was seen and evaluated for days later at any time had negative x-rays is advised to followup with her PCP. Patient denies any improvement in her symptoms. She states her PCP told her to take Motrin instead of her hydrocodone for pain. Denies any fevers, chills, nausea, vomiting, night sweats, numbness or weakness, history of cancer, history of IVDA.  Patient is a 58 y.o. female presenting with back pain.  Back Pain Associated symptoms: no fever     Past Medical History  Diagnosis Date  . Hypertension   . Coronary artery disease 06/2012    s/p NSTEMI with 100% LAD occlusion -- 2 Overlappin Xience DES Stents (2.5 mm x 28, 3.0 mm 18 mm)  . Ischemic cardiomyopathy 06/2012    EF ~40-45%   Past Surgical History  Procedure Laterality Date  . Knee surgery    . Cardiac catheterization    . Coronary stent placement  07/15/12   Family History  Problem Relation Age of Onset  . Hypertension Mother   . Hypertension Father   . Hypertension Sister   . Hypertension Brother   . Coronary artery disease Brother 2055   History  Substance Use Topics  . Smoking status: Current Every Day Smoker -- 1.00 packs/day for 20 years  . Smokeless tobacco: Not on file  . Alcohol Use: No   OB History   Grav Para Term Preterm Abortions TAB SAB Ect  Mult Living                 Review of Systems  Constitutional: Negative for fever and chills.  Musculoskeletal: Positive for back pain and myalgias.  All other systems reviewed and are negative.     Allergies  Lipitor  Home Medications   Prior to Admission medications   Medication Sig Start Date End Date Taking? Authorizing Provider  Aspirin-Acetaminophen (GOODYS BODY PAIN PO) Take 1 packet by mouth daily as needed (Pain).    Yes Historical Provider, MD  benazepril-hydrochlorthiazide (LOTENSIN HCT) 20-25 MG per tablet Take 1 tablet by mouth daily.   Yes Historical Provider, MD  diazepam (VALIUM) 5 MG tablet Take 5 mg by mouth at bedtime.   Yes Historical Provider, MD  ibuprofen (ADVIL,MOTRIN) 800 MG tablet Take 800 mg by mouth every 8 (eight) hours as needed for moderate pain.   Yes Historical Provider, MD  nitroGLYCERIN (NITROSTAT) 0.4 MG SL tablet Place 1 tablet (0.4 mg total) under the tongue every 5 (five) minutes as needed. For chest pain. 12/03/13  Yes Marykay Lexavid W Harding, MD  Omega-3 Fatty Acids (FISH OIL PO) Take 1 capsule by mouth daily.   Yes Historical Provider, MD  rosuvastatin (CRESTOR) 5 MG tablet Take 5 mg by mouth daily.   Yes Historical Provider, MD  meloxicam (MOBIC) 7.5 MG tablet Take 2 tablets (15 mg total) by mouth daily. 07/24/14   Victorino DikeJennifer  L Makaylie Dedeaux, PA-C   BP 166/93  Pulse 57  Temp(Src) 97.9 F (36.6 C) (Oral)  Resp 18  SpO2 97% Physical Exam  Nursing note and vitals reviewed. Constitutional: She is oriented to person, place, and time. She appears well-developed and well-nourished. No distress.  HENT:  Head: Normocephalic and atraumatic.  Right Ear: External ear normal.  Left Ear: External ear normal.  Nose: Nose normal.  Mouth/Throat: Oropharynx is clear and moist. No oropharyngeal exudate.  Eyes: Conjunctivae and EOM are normal. Pupils are equal, round, and reactive to light.  Neck: Normal range of motion. Neck supple.  Cardiovascular: Normal rate,  regular rhythm, normal heart sounds and intact distal pulses.   Pulmonary/Chest: Effort normal and breath sounds normal. No respiratory distress.  Abdominal: Soft. There is no tenderness.  Musculoskeletal: Normal range of motion.       Right hip: She exhibits tenderness. She exhibits normal range of motion, normal strength, no bony tenderness, no swelling, no crepitus, no deformity and no laceration.       Lumbar back: Normal.       Legs: No overlying skin changes.  Neurological: She is alert and oriented to person, place, and time. She has normal strength. No cranial nerve deficit. Gait normal. GCS eye subscore is 4. GCS verbal subscore is 5. GCS motor subscore is 6.  Sensation grossly intact.  No pronator drift.  Bilateral heel-knee-shin intact.  Skin: Skin is warm and dry. She is not diaphoretic.    ED Course  Procedures (including critical care time) Medications  ibuprofen (ADVIL,MOTRIN) tablet 800 mg (800 mg Oral Given 07/24/14 0720)    Labs Review Labs Reviewed - No data to display  Imaging Review No results found.   EKG Interpretation None      MDM   Final diagnoses:  Right hip pain    Filed Vitals:   07/24/14 0715  BP: 166/93  Pulse: 57  Temp:   Resp:    Afebrile, NAD, non-toxic appearing, AAOx4.   Patient with back and right hip pain.  No neurological deficits and normal neuro exam.  Ambulatory in ED w/o pain or difficulty.  No loss of bowel or bladder control.  No concern for cauda equina.  No fever, night sweats, weight loss, h/o cancer, IVDU.  RICE protocol indicated and discussed with patient.  Advised patient to continue to take her home pain medication. Discussed PCP followup. Patient is agreeable to plan. Patient is stable at time of discharge     Jeannetta Ellis, PA-C 07/24/14 4540

## 2014-07-24 NOTE — Discharge Instructions (Signed)
Please follow up with your primary care physician in 1-2 days. If you do not have one please call the [facility] number listed above. He continues to take her pain medication prescribed by her primary care physician. He should discuss possibly seeing an orthopedist located close to you and/or going to physical therapy. Please follow Rice below. Please read all discharge instructions and return precautions.  Back Pain, Adult Low back pain is very common. About 1 in 5 people have back pain.The cause of low back pain is rarely dangerous. The pain often gets better over time.About half of people with a sudden onset of back pain feel better in just 2 weeks. About 8 in 10 people feel better by 6 weeks.  CAUSES Some common causes of back pain include:  Strain of the muscles or ligaments supporting the spine.  Wear and tear (degeneration) of the spinal discs.  Arthritis.  Direct injury to the back. DIAGNOSIS Most of the time, the direct cause of low back pain is not known.However, back pain can be treated effectively even when the exact cause of the pain is unknown.Answering your caregiver's questions about your overall health and symptoms is one of the most accurate ways to make sure the cause of your pain is not dangerous. If your caregiver needs more information, he or she may order lab work or imaging tests (X-rays or MRIs).However, even if imaging tests show changes in your back, this usually does not require surgery. HOME CARE INSTRUCTIONS For many people, back pain returns.Since low back pain is rarely dangerous, it is often a condition that people can learn to [facility] their own.   Remain active. It is stressful on the back to sit or stand in one place. Do not sit, drive, or stand in one place for more than 30 minutes at a time. Take short walks on level surfaces as soon as pain allows.Try to increase the length of time you walk each day.  Do not stay in bed.Resting  more than 1 or 2 days can delay your recovery.  Do not avoid exercise or work.Your body is made to move.It is not dangerous to be active, even though your back may hurt.Your back will likely heal faster if you return to being active before your pain is gone.  Pay attention to your body when you bend and lift. Many people have less discomfortwhen lifting if they bend their knees, keep the load close to their bodies,and avoid twisting. Often, the most comfortable positions are those that put less stress on your recovering back.  Find a comfortable position to sleep. Use a firm mattress and lie on your side with your knees slightly bent. If you lie on your back, put a pillow under your knees.  Only take over-the-counter or prescription medicines as directed by your caregiver. Over-the-counter medicines to reduce pain and inflammation are often the most helpful.Your caregiver may prescribe muscle relaxant drugs.These medicines help dull your pain so you can more quickly return to your normal activities and healthy exercise.  Put ice on the injured area.  Put ice in a plastic bag.  Place a towel between your skin and the bag.  Leave the ice on for 15-20 minutes, 03-04 times a day for the first 2 to 3 days. After that, ice and heat may be alternated to reduce pain and spasms.  Ask your caregiver about trying back exercises and gentle massage. This may be of some benefit.  Avoid feeling anxious  or stressed.Stress increases muscle tension and can worsen back pain.It is important to recognize when you are anxious or stressed and learn ways to manage it.Exercise is a great option. SEEK MEDICAL CARE IF:  You have pain that is not relieved with rest or medicine.  You have pain that does not improve in 1 week.  You have new symptoms.  You are generally not feeling well. SEEK IMMEDIATE MEDICAL CARE IF:   You have pain that radiates from your back into your legs.  You develop new bowel  or bladder control problems.  You have unusual weakness or numbness in your arms or legs.  You develop nausea or vomiting.  You develop abdominal pain.  You feel faint. Document Released: 12/16/2005 Document Revised: 06/16/2012 Document Reviewed: 04/19/2014 [facility] Patient Information 2015 [place], [place]. This information is not intended to replace advice given to you by your health care provider. Make sure you discuss any questions you have with your health care provider.  RICE: Routine Care for Injuries The routine care of many injuries includes Rest, Ice, Compression, and Elevation (RICE). HOME CARE INSTRUCTIONS  Rest is needed to allow your body to heal. Routine activities can usually be resumed when comfortable. Injured tendons and bones can take up to 6 weeks to heal. Tendons are the cord-like structures that attach muscle to bone.  Ice following an injury helps keep the swelling down and reduces pain.  Put ice in a plastic bag.  Place a towel between your skin and the bag.  Leave the ice on for 15-20 minutes, 3-4 times a day, or as directed by your health care provider. Do this while awake, for the first 24 to 48 hours. After that, continue as directed by your caregiver.  Compression helps keep swelling down. It also gives support and helps with discomfort. If an elastic bandage has been applied, it should be removed and reapplied every 3 to 4 hours. It should not be applied tightly, but firmly enough to keep swelling down. Watch fingers or toes for swelling, bluish discoloration, coldness, numbness, or excessive pain. If any of these problems occur, remove the bandage and reapply loosely. Contact your caregiver if these problems continue.  Elevation helps reduce swelling and decreases pain. With extremities, such as the arms, hands, legs, and feet, the injured area should be placed near or above the level of the heart, if possible. SEEK IMMEDIATE MEDICAL CARE IF:  You have  persistent pain and swelling.  You develop redness, numbness, or unexpected weakness.  Your symptoms are getting worse rather than improving after several days. These symptoms may indicate that further evaluation or further X-rays are needed. Sometimes, X-rays may not show a small broken bone (fracture) until 1 week or 10 days later. Make a follow-up appointment with your caregiver. Ask when your X-ray results will be ready. Make sure you get your X-ray results. Document Released: 03/30/2001 Document Revised: 12/21/2013 Document Reviewed: 05/17/2011 [facility]ExitCare Patient Information 2015 [place], [place]. This information is not intended to replace advice given to you by your health care provider. Make sure you discuss any questions you have with your health care provider.

## 2014-07-24 NOTE — ED Notes (Signed)
Presents with right sided back/hip pain goes into right leg, began 3 weeks ago, Ibuprofen not helping. Cms intact.

## 2014-07-24 NOTE — ED Notes (Signed)
Patient is up walking around the room without showing any signs of pain or discomfort. She was bending down to touch her ankle without any difficulty.

## 2014-07-24 NOTE — ED Notes (Signed)
Patient discharged with all personal belongings. 

## 2014-07-25 NOTE — ED Provider Notes (Signed)
Medical screening examination/treatment/procedure(s) were performed by non-physician practitioner and as supervising physician I was immediately available for consultation/collaboration.   EKG Interpretation None        Lun Muro, MD 07/25/14 0842 

## 2014-08-25 ENCOUNTER — Other Ambulatory Visit: Payer: Self-pay | Admitting: *Deleted

## 2014-08-25 MED ORDER — BENAZEPRIL-HYDROCHLOROTHIAZIDE 20-25 MG PO TABS: 1.0000 | ORAL_TABLET | Freq: Every day | ORAL | Status: AC

## 2014-08-25 NOTE — Telephone Encounter (Signed)
Rx was sent to pharmacy electronically. Last OV 02/2013 

## 2014-08-25 NOTE — Telephone Encounter (Signed)
Rx was sent to pharmacy electronically. 

## 2014-10-21 ENCOUNTER — Encounter (HOSPITAL_COMMUNITY): Payer: Self-pay | Admitting: Emergency Medicine

## 2014-10-21 ENCOUNTER — Emergency Department (HOSPITAL_COMMUNITY): Payer: Self-pay

## 2014-10-21 ENCOUNTER — Emergency Department (HOSPITAL_COMMUNITY)
Admission: EM | Admit: 2014-10-21 | Discharge: 2014-10-21 | Disposition: A | Discharge status: Home / Self Care | Payer: Self-pay | Attending: Emergency Medicine | Admitting: Emergency Medicine

## 2014-10-21 DIAGNOSIS — Z79899 Other long term (current) drug therapy: Secondary | ICD-10-CM | POA: Insufficient documentation

## 2014-10-21 DIAGNOSIS — Z791 Long term (current) use of non-steroidal anti-inflammatories (NSAID): Secondary | ICD-10-CM | POA: Insufficient documentation

## 2014-10-21 DIAGNOSIS — I2510 Atherosclerotic heart disease of native coronary artery without angina pectoris: Secondary | ICD-10-CM | POA: Insufficient documentation

## 2014-10-21 DIAGNOSIS — R079 Chest pain, unspecified: Secondary | ICD-10-CM

## 2014-10-21 DIAGNOSIS — I10 Essential (primary) hypertension: Secondary | ICD-10-CM | POA: Insufficient documentation

## 2014-10-21 DIAGNOSIS — M79602 Pain in left arm: Secondary | ICD-10-CM

## 2014-10-21 DIAGNOSIS — Z720 Tobacco use: Secondary | ICD-10-CM | POA: Insufficient documentation

## 2014-10-21 DIAGNOSIS — Z7982 Long term (current) use of aspirin: Secondary | ICD-10-CM | POA: Insufficient documentation

## 2014-10-21 LAB — CBC WITH DIFFERENTIAL/PLATELET
BASOS PCT: 0 % (ref 0–1)
Basophils Absolute: 0 10*3/uL (ref 0.0–0.1)
EOS PCT: 2 % (ref 0–5)
Eosinophils Absolute: 0.1 10*3/uL (ref 0.0–0.7)
HEMATOCRIT: 41.3 % (ref 36.0–46.0)
HEMOGLOBIN: 13.6 g/dL (ref 12.0–15.0)
Lymphocytes Relative: 55 % — ABNORMAL HIGH (ref 12–46)
Lymphs Abs: 2.7 10*3/uL (ref 0.7–4.0)
MCH: 28.4 pg (ref 26.0–34.0)
MCHC: 32.9 g/dL (ref 30.0–36.0)
MCV: 86.2 fL (ref 78.0–100.0)
MONO ABS: 0.4 10*3/uL (ref 0.1–1.0)
MONOS PCT: 7 % (ref 3–12)
NEUTROS ABS: 1.8 10*3/uL (ref 1.7–7.7)
Neutrophils Relative %: 36 % — ABNORMAL LOW (ref 43–77)
Platelets: 306 10*3/uL (ref 150–400)
RBC: 4.79 MIL/uL (ref 3.87–5.11)
RDW: 14.7 % (ref 11.5–15.5)
WBC: 5 10*3/uL (ref 4.0–10.5)

## 2014-10-21 LAB — BASIC METABOLIC PANEL
Anion gap: 14 (ref 5–15)
BUN: 12 mg/dL (ref 6–23)
CALCIUM: 9.9 mg/dL (ref 8.4–10.5)
CHLORIDE: 97 meq/L (ref 96–112)
CO2: 27 meq/L (ref 19–32)
CREATININE: 0.87 mg/dL (ref 0.50–1.10)
GFR calc non Af Amer: 74 mL/min — ABNORMAL LOW (ref >90)
GFR, EST AFRICAN AMERICAN: 85 mL/min — AB (ref >90)
Glucose, Bld: 104 mg/dL — ABNORMAL HIGH (ref 70–99)
Potassium: 3.4 mEq/L — ABNORMAL LOW (ref 3.7–5.3)
Sodium: 138 mEq/L (ref 137–147)

## 2014-10-21 LAB — TROPONIN I: Troponin I: <0.3 ng/mL (ref <0.30)

## 2014-10-21 MED ORDER — CYCLOBENZAPRINE HCL 10 MG PO TABS: 10.0000 mg | ORAL_TABLET | Freq: Two times a day (BID) | ORAL | Status: DC | PRN

## 2014-10-21 MED ORDER — NAPROXEN 500 MG PO TABS: 500.0000 mg | ORAL_TABLET | Freq: Two times a day (BID) | ORAL | Status: DC

## 2014-10-21 NOTE — Discharge Instructions (Signed)
Tests were normal. Medication for muscular pain and muscle spasm. Return if worse or followup your Dr.

## 2014-10-21 NOTE — ED Notes (Signed)
Patient states she has a "heavy" feeling under breasts bilaterally and in L arm.  Denies chest pain, back pain, n/v/SOB, diaphoresis.  States pain in her arm is more noticeable when she turns her head laterally.

## 2014-10-21 NOTE — ED Notes (Signed)
Patient with no complaints at this time. Respirations even and unlabored. Skin warm/dry. Discharge instructions reviewed with patient at this time. Patient given opportunity to voice concerns/ask questions. Patient discharged at this time and left Emergency Department with steady gait.   

## 2014-10-21 NOTE — ED Provider Notes (Signed)
CSN: [id number]     Arrival date & time 10/21/14  1620 History  This chart was scribed for Donnetta HutchingBrian Phyllip Claw, MD by Richarda Overlieichard Holland, ED Scribe. This patient was seen in room APA09/APA09 and the patient's care was started 4:46 PM.    Chief Complaint  Patient presents with  . Chest Pain    lt sided chest pressure   The history is provided by the patient. No language interpreter was used.   HPI Comments: [name] is a 58 y.o. female who presents to the Emergency Department complaining of left arm pain that started yesterday when patient was doing manual labor moving brush outside. She says her left arm pain worsens with certain movements. She reports an associated bilateral "heaviness" in her abdomen under her breasts as well. She states she had a MI 2 years ago and reports that her current symptoms are NOT similar. She denies SOB, diaphoresis, nausea as associated symptoms.   PCP [facility]   Past Medical History  Diagnosis Date  . Hypertension   . Coronary artery disease 06/2012    s/p NSTEMI with 100% LAD occlusion -- 2 Overlappin Xience DES Stents (2.5 mm x 28, 3.0 mm 18 mm)  . Ischemic cardiomyopathy 06/2012    EF ~40-45%   Past Surgical History  Procedure Laterality Date  . Knee surgery    . Cardiac catheterization    . Coronary stent placement  07/15/12   Family History  Problem Relation Age of Onset  . Hypertension Mother   . Hypertension Father   . Hypertension Sister   . Hypertension Brother   . Coronary artery disease Brother 8855   History  Substance Use Topics  . Smoking status: Current Every Day Smoker -- 1.00 packs/day for 20 years  . Smokeless tobacco: Not on file  . Alcohol Use: No   OB History   Grav Para Term Preterm Abortions TAB SAB Ect Mult Living                 Review of Systems  Constitutional: Negative for diaphoresis.  Respiratory: Negative for shortness of breath.   Gastrointestinal: Positive for abdominal pain.  Musculoskeletal: Positive for  myalgias.  All other systems reviewed and are negative.   Allergies  Lipitor  Home Medications   Prior to Admission medications   Medication Sig Start Date End Date Taking? Authorizing Provider  aspirin EC 81 MG tablet Take 81 mg by mouth every morning.   Yes Historical Provider, MD  Aspirin-Acetaminophen (GOODYS BODY PAIN PO) Take 1 packet by mouth 2 (two) times daily as needed (Pain).    Yes Historical Provider, MD  benazepril-hydrochlorthiazide (LOTENSIN HCT) 20-25 MG per tablet Take 1 tablet by mouth daily. <please make appointment with cardiologist for refills> 08/25/14  Yes Chrystie NoseKenneth C. Hilty, MD  diazepam (VALIUM) 5 MG tablet Take 5 mg by mouth at bedtime as needed for anxiety (for sleep).    Yes Historical Provider, MD  ibuprofen (ADVIL,MOTRIN) 800 MG tablet Take 800 mg by mouth daily as needed for moderate pain.    Yes Historical Provider, MD  nitroGLYCERIN (NITROSTAT) 0.4 MG SL tablet Place 1 tablet (0.4 mg total) under the tongue every 5 (five) minutes as needed. For chest pain. 12/03/13  Yes Marykay Lexavid W Harding, MD  Omega-3 Fatty Acids (FISH OIL PO) Take 1 capsule by mouth daily.   Yes Historical Provider, MD  oxyCODONE-acetaminophen (PERCOCET) 7.5-325 MG per tablet Take 1 tablet by mouth 4 (four) times daily as needed for  pain.   Yes Historical Provider, MD  rosuvastatin (CRESTOR) 5 MG tablet Take 5 mg by mouth at bedtime.    Yes Historical Provider, MD  cyclobenzaprine (FLEXERIL) 10 MG tablet Take 1 tablet (10 mg total) by mouth 2 (two) times daily as needed for muscle spasms. 10/21/14   Donnetta Hutching, MD  naproxen (NAPROSYN) 500 MG tablet Take 1 tablet (500 mg total) by mouth 2 (two) times daily. 10/21/14   Donnetta Hutching, MD   BP 155/97  Pulse 110  Temp(Src) 98.4 F (36.9 C) (Oral)  Resp 18  Ht 5\' 7"  (1.702 m)  Wt 230 lb (104.327 kg)  BMI 36.01 kg/m2  SpO2 98% Physical Exam  Nursing note and vitals reviewed. Constitutional: She is oriented to person, place, and time. She appears  well-developed and well-nourished.  HENT:  Head: Normocephalic and atraumatic.  Eyes: Conjunctivae and EOM are normal. Pupils are equal, round, and reactive to light.  Neck: Normal range of motion. Neck supple.  Cardiovascular: Normal rate, regular rhythm and normal heart sounds.   Pulmonary/Chest: Effort normal and breath sounds normal.  Abdominal: Soft. Bowel sounds are normal.  Minimal upper abdominal tenderness  Musculoskeletal: Normal range of motion.  Muscle tenderness in proximal biceps in left arm  Neurological: She is alert and oriented to person, place, and time.  Skin: Skin is warm and dry.  Psychiatric: She has a normal mood and affect. Her behavior is normal.    ED Course  Procedures  DIAGNOSTIC STUDIES: Oxygen Saturation is 98% on RA, normal by my interpretation.    COORDINATION OF CARE: 4:51 PM Discussed treatment plan with pt at bedside and pt agreed to plan.   Labs Review Labs Reviewed  CBC WITH DIFFERENTIAL - Abnormal; Notable for the following:    Neutrophils Relative % 36 (*)    Lymphocytes Relative 55 (*)    All other components within normal limits  BASIC METABOLIC PANEL - Abnormal; Notable for the following:    Potassium 3.4 (*)    Glucose, Bld 104 (*)    GFR calc non Af Amer 74 (*)    GFR calc Af Amer 85 (*)    All other components within normal limits  TROPONIN I    Imaging Review Dg Chest 2 View  10/21/2014   CLINICAL DATA:  Chest pain. Patient complaining of chest heaviness under both breasts today. History of hypertension and coronary artery disease.  EXAM: CHEST  2 VIEW  COMPARISON:  07/12/2012.  FINDINGS: Cardiac silhouette is normal in size and configuration. Aorta is mildly uncoiled. No mediastinal or hilar masses or evidence of adenopathy. Lungs are mildly hyperexpanded but clear. No pleural effusion or pneumothorax.  Bony thorax is intact.  IMPRESSION: No active cardiopulmonary disease.   Electronically Signed   By: Amie Portland M.D.   On:  10/21/2014 17:42     EKG Interpretation None      Date: 10/21/2014  Rate: 98  Rhythm: normal sinus rhythm  QRS Axis: normal  Intervals: normal  ST/T Wave abnormalities: NS ST/T wave changes  Conduction Disutrbances: none  Narrative Interpretation: unremarkable    MDM   Final diagnoses:  Chest pain  Left arm pain  Left arm pain is most likely musculoskeletal pain. No anterior chest pain, dyspnea, nausea. Screening labs, chest x-ray, EKG, troponin all normal. Discharge medications Flexeril 10 mg and Naprosyn 500 mg  I personally performed the services described in this documentation, which was scribed in my presence. The recorded information has been  reviewed and is accurate.      Donnetta HutchingBrian Teiara Baria, MD 10/21/14 [id number]

## 2014-10-21 NOTE — ED Notes (Signed)
Pt has had lt chest pressure and lt arm pain x1 day, states she was doing yard work when she felt the pressure, pt has HX of prior MI.

## 2014-12-08 ENCOUNTER — Encounter (HOSPITAL_COMMUNITY): Payer: Self-pay | Admitting: Cardiology

## 2014-12-30 ENCOUNTER — Emergency Department (HOSPITAL_COMMUNITY): Payer: Medicare HMO

## 2014-12-30 ENCOUNTER — Inpatient Hospital Stay (HOSPITAL_COMMUNITY)
Admission: EM | Admit: 2014-12-30 | Discharge: 2015-01-01 | DRG: 247 | Disposition: A | Discharge status: Home / Self Care | Payer: Medicare HMO | Attending: Cardiology | Admitting: Cardiology

## 2014-12-30 ENCOUNTER — Ambulatory Visit (HOSPITAL_COMMUNITY): Admit: 2014-12-30 | Payer: Self-pay | Admitting: Cardiovascular Disease

## 2014-12-30 ENCOUNTER — Encounter (HOSPITAL_COMMUNITY)
Admission: EM | Disposition: A | Discharge status: Home / Self Care | Payer: Medicare HMO | Source: Home / Self Care | Attending: Cardiology

## 2014-12-30 ENCOUNTER — Encounter (HOSPITAL_COMMUNITY): Payer: Self-pay | Admitting: Emergency Medicine

## 2014-12-30 DIAGNOSIS — Z79899 Other long term (current) drug therapy: Secondary | ICD-10-CM

## 2014-12-30 DIAGNOSIS — I213 ST elevation (STEMI) myocardial infarction of unspecified site: Secondary | ICD-10-CM | POA: Diagnosis present

## 2014-12-30 DIAGNOSIS — I10 Essential (primary) hypertension: Secondary | ICD-10-CM | POA: Diagnosis present

## 2014-12-30 DIAGNOSIS — Z8249 Family history of ischemic heart disease and other diseases of the circulatory system: Secondary | ICD-10-CM | POA: Diagnosis not present

## 2014-12-30 DIAGNOSIS — Z7982 Long term (current) use of aspirin: Secondary | ICD-10-CM

## 2014-12-30 DIAGNOSIS — I2119 ST elevation (STEMI) myocardial infarction involving other coronary artery of inferior wall: Secondary | ICD-10-CM

## 2014-12-30 DIAGNOSIS — R079 Chest pain, unspecified: Secondary | ICD-10-CM

## 2014-12-30 DIAGNOSIS — Z955 Presence of coronary angioplasty implant and graft: Secondary | ICD-10-CM | POA: Diagnosis not present

## 2014-12-30 DIAGNOSIS — I2584 Coronary atherosclerosis due to calcified coronary lesion: Secondary | ICD-10-CM | POA: Diagnosis not present

## 2014-12-30 DIAGNOSIS — Z23 Encounter for immunization: Secondary | ICD-10-CM

## 2014-12-30 DIAGNOSIS — F17200 Nicotine dependence, unspecified, uncomplicated: Secondary | ICD-10-CM | POA: Diagnosis present

## 2014-12-30 DIAGNOSIS — I255 Ischemic cardiomyopathy: Secondary | ICD-10-CM | POA: Diagnosis present

## 2014-12-30 DIAGNOSIS — I2111 ST elevation (STEMI) myocardial infarction involving right coronary artery: Secondary | ICD-10-CM

## 2014-12-30 DIAGNOSIS — Z720 Tobacco use: Secondary | ICD-10-CM | POA: Diagnosis present

## 2014-12-30 DIAGNOSIS — I2510 Atherosclerotic heart disease of native coronary artery without angina pectoris: Secondary | ICD-10-CM | POA: Diagnosis present

## 2014-12-30 HISTORY — DX: Unspecified osteoarthritis, unspecified site: M19.90

## 2014-12-30 HISTORY — PX: LEFT HEART CATHETERIZATION WITH CORONARY ANGIOGRAM: SHX5451

## 2014-12-30 HISTORY — PX: PERCUTANEOUS CORONARY STENT INTERVENTION (PCI-S): SHX5485

## 2014-12-30 LAB — BASIC METABOLIC PANEL
ANION GAP: 8 (ref 5–15)
BUN: 13 mg/dL (ref 6–23)
CO2: 24 mmol/L (ref 19–32)
Calcium: 8.3 mg/dL — ABNORMAL LOW (ref 8.4–10.5)
Chloride: 105 mEq/L (ref 96–112)
Creatinine, Ser: 0.77 mg/dL (ref 0.50–1.10)
GFR calc Af Amer: >90 mL/min (ref >90)
Glucose, Bld: 127 mg/dL — ABNORMAL HIGH (ref 70–99)
POTASSIUM: 2.9 mmol/L — AB (ref 3.5–5.1)
Sodium: 137 mmol/L (ref 135–145)

## 2014-12-30 LAB — I-STAT TROPONIN, ED: Troponin i, poc: 0.04 ng/mL (ref 0.00–0.08)

## 2014-12-30 LAB — MRSA PCR SCREENING: MRSA by PCR: NEGATIVE

## 2014-12-30 LAB — I-STAT CHEM 8, ED
BUN: 15 mg/dL (ref 6–23)
CALCIUM ION: 1.03 mmol/L — AB (ref 1.12–1.23)
Chloride: 105 mEq/L (ref 96–112)
Creatinine, Ser: 0.8 mg/dL (ref 0.50–1.10)
GLUCOSE: 127 mg/dL — AB (ref 70–99)
HCT: 43 % (ref 36.0–46.0)
Hemoglobin: 14.6 g/dL (ref 12.0–15.0)
POTASSIUM: 4 mmol/L (ref 3.5–5.1)
Sodium: 138 mmol/L (ref 135–145)
TCO2: 21 mmol/L (ref 0–100)

## 2014-12-30 LAB — CBC
HCT: 37.2 % (ref 36.0–46.0)
Hemoglobin: 11.9 g/dL — ABNORMAL LOW (ref 12.0–15.0)
MCH: 27.9 pg (ref 26.0–34.0)
MCHC: 32 g/dL (ref 30.0–36.0)
MCV: 87.1 fL (ref 78.0–100.0)
Platelets: 260 10*3/uL (ref 150–400)
RBC: 4.27 MIL/uL (ref 3.87–5.11)
RDW: 14.5 % (ref 11.5–15.5)
WBC: 5.4 10*3/uL (ref 4.0–10.5)

## 2014-12-30 LAB — TROPONIN I
Troponin I: 0.07 ng/mL — ABNORMAL HIGH (ref <0.031)
Troponin I: 5.29 ng/mL (ref <0.031)

## 2014-12-30 LAB — PROTIME-INR
INR: 1 (ref 0.00–1.49)
Prothrombin Time: 13.3 seconds (ref 11.6–15.2)

## 2014-12-30 LAB — APTT: aPTT: 33 seconds (ref 24–37)

## 2014-12-30 SURGERY — LEFT HEART CATHETERIZATION WITH CORONARY ANGIOGRAM
Anesthesia: LOCAL

## 2014-12-30 MED ORDER — MORPHINE SULFATE 4 MG/ML IJ SOLN
4.0000 mg | Freq: Once | INTRAMUSCULAR | Status: AC
  Administered 2014-12-30: 4 mg via INTRAVENOUS

## 2014-12-30 MED ORDER — ASPIRIN EC 81 MG PO TBEC: 81.0000 mg | DELAYED_RELEASE_TABLET | Freq: Every morning | ORAL | Status: DC

## 2014-12-30 MED ORDER — ACETAMINOPHEN 325 MG PO TABS: 650.0000 mg | ORAL_TABLET | ORAL | Status: DC | PRN

## 2014-12-30 MED ORDER — NITROGLYCERIN 0.4 MG SL SUBL: 0.4000 mg | SUBLINGUAL_TABLET | SUBLINGUAL | Status: DC | PRN

## 2014-12-30 MED ORDER — MORPHINE SULFATE 4 MG/ML IJ SOLN
INTRAMUSCULAR | Status: AC
  Filled 2014-12-30: qty 1

## 2014-12-30 MED ORDER — ONDANSETRON HCL 4 MG/2ML IJ SOLN: 4.0000 mg | Freq: Four times a day (QID) | INTRAMUSCULAR | Status: DC | PRN

## 2014-12-30 MED ORDER — HEPARIN SODIUM (PORCINE) 5000 UNIT/ML IJ SOLN
4000.0000 [IU] | INTRAMUSCULAR | Status: AC
  Administered 2014-12-30: 4000 [IU] via INTRAVENOUS

## 2014-12-30 MED ORDER — ACETAMINOPHEN 325 MG PO TABS
650.0000 mg | ORAL_TABLET | ORAL | Status: DC | PRN
Start: 2014-12-30 — End: 2015-01-01

## 2014-12-30 MED ORDER — ROSUVASTATIN CALCIUM 10 MG PO TABS
5.0000 mg | ORAL_TABLET | Freq: Every day | ORAL | Status: DC
  Administered 2014-12-30 – 2014-12-31 (×2): 5 mg via ORAL
  Filled 2014-12-30: qty 1
  Filled 2014-12-30: qty 0.5
  Filled 2014-12-30: qty 1

## 2014-12-30 MED ORDER — SODIUM CHLORIDE 0.9 % IV SOLN
Freq: Once | INTRAVENOUS | Status: AC
  Administered 2014-12-30: 14:00:00 via INTRAVENOUS

## 2014-12-30 MED ORDER — HEPARIN (PORCINE) IN NACL 100-0.45 UNIT/ML-% IJ SOLN: 1100.0000 [IU]/h | INTRAMUSCULAR | Status: DC

## 2014-12-30 MED ORDER — OMEGA-3-ACID ETHYL ESTERS 1 G PO CAPS
1.0000 g | ORAL_CAPSULE | Freq: Every day | ORAL | Status: DC
  Administered 2014-12-30 – 2015-01-01 (×3): 1 g via ORAL
  Filled 2014-12-30 (×3): qty 1

## 2014-12-30 MED ORDER — ASPIRIN EC 81 MG PO TBEC
81.0000 mg | DELAYED_RELEASE_TABLET | Freq: Every day | ORAL | Status: DC
  Administered 2014-12-31 – 2015-01-01 (×2): 81 mg via ORAL
  Filled 2014-12-30 (×2): qty 1

## 2014-12-30 MED ORDER — ASPIRIN EC 81 MG PO TBEC: 81.0000 mg | DELAYED_RELEASE_TABLET | Freq: Every day | ORAL | Status: DC

## 2014-12-30 MED ORDER — ASPIRIN 300 MG RE SUPP
300.0000 mg | RECTAL | Status: DC
Start: 2014-12-30 — End: 2014-12-31
  Filled 2014-12-30: qty 1

## 2014-12-30 MED ORDER — ATROPINE SULFATE 0.1 MG/ML IJ SOLN
INTRAMUSCULAR | Status: AC
  Filled 2014-12-30: qty 10

## 2014-12-30 MED ORDER — BENAZEPRIL HCL 20 MG PO TABS
20.0000 mg | ORAL_TABLET | Freq: Every day | ORAL | Status: DC
  Administered 2014-12-31 – 2015-01-01 (×2): 20 mg via ORAL
  Filled 2014-12-30 (×2): qty 1

## 2014-12-30 MED ORDER — MORPHINE SULFATE 4 MG/ML IJ SOLN
INTRAMUSCULAR | Status: AC
Start: 2014-12-30 — End: 2014-12-30
  Administered 2014-12-30: 4 mg via INTRAVENOUS
  Filled 2014-12-30: qty 1

## 2014-12-30 MED ORDER — CYCLOBENZAPRINE HCL 10 MG PO TABS
10.0000 mg | ORAL_TABLET | Freq: Two times a day (BID) | ORAL | Status: DC | PRN
  Administered 2014-12-31: 10 mg via ORAL
  Filled 2014-12-30 (×2): qty 1

## 2014-12-30 MED ORDER — OXYCODONE-ACETAMINOPHEN 7.5-325 MG PO TABS: 1.0000 | ORAL_TABLET | Freq: Four times a day (QID) | ORAL | Status: DC | PRN

## 2014-12-30 MED ORDER — OXYCODONE-ACETAMINOPHEN 5-325 MG PO TABS
1.0000 | ORAL_TABLET | Freq: Four times a day (QID) | ORAL | Status: DC | PRN
  Administered 2015-01-01: 1 via ORAL
  Filled 2014-12-30: qty 1

## 2014-12-30 MED ORDER — SODIUM CHLORIDE 0.9 % IV SOLN
0.2500 mg/kg/h | INTRAVENOUS | Status: DC
  Administered 2014-12-30: 0.25 mg/kg/h via INTRAVENOUS
  Filled 2014-12-30 (×2): qty 250

## 2014-12-30 MED ORDER — HYDROCHLOROTHIAZIDE 25 MG PO TABS
25.0000 mg | ORAL_TABLET | Freq: Every day | ORAL | Status: DC
  Administered 2014-12-31 – 2015-01-01 (×2): 25 mg via ORAL
  Filled 2014-12-30 (×2): qty 1

## 2014-12-30 MED ORDER — ASPIRIN 81 MG PO CHEW: 324.0000 mg | CHEWABLE_TABLET | ORAL | Status: DC

## 2014-12-30 MED ORDER — MORPHINE SULFATE 2 MG/ML IJ SOLN
2.0000 mg | INTRAMUSCULAR | Status: DC | PRN
Start: 2014-12-30 — End: 2015-01-01

## 2014-12-30 MED ORDER — BIVALIRUDIN BOLUS VIA INFUSION
0.1000 mg/kg | Freq: Once | INTRAVENOUS | Status: DC
  Filled 2014-12-30: qty 11

## 2014-12-30 MED ORDER — TICAGRELOR 90 MG PO TABS
90.0000 mg | ORAL_TABLET | Freq: Two times a day (BID) | ORAL | Status: DC
  Administered 2014-12-30 – 2015-01-01 (×4): 90 mg via ORAL
  Filled 2014-12-30 (×5): qty 1

## 2014-12-30 MED ORDER — PNEUMOCOCCAL VAC POLYVALENT 25 MCG/0.5ML IJ INJ
0.5000 mL | INJECTION | INTRAMUSCULAR | Status: AC
  Administered 2014-12-31: 0.5 mL via INTRAMUSCULAR
  Filled 2014-12-30: qty 0.5

## 2014-12-30 MED ORDER — OXYCODONE HCL 5 MG PO TABS
2.5000 mg | ORAL_TABLET | Freq: Four times a day (QID) | ORAL | Status: DC | PRN
  Administered 2015-01-01: 2.5 mg via ORAL
  Filled 2014-12-30: qty 1

## 2014-12-30 MED ORDER — SODIUM CHLORIDE 0.9 % IV SOLN
0.2500 mg/kg/h | INTRAVENOUS | Status: DC
Start: 2014-12-30 — End: 2014-12-30
  Filled 2014-12-30: qty 250

## 2014-12-30 MED ORDER — DIAZEPAM 5 MG PO TABS
5.0000 mg | ORAL_TABLET | Freq: Every evening | ORAL | Status: DC | PRN
  Administered 2014-12-30 – 2014-12-31 (×2): 5 mg via ORAL
  Filled 2014-12-30 (×2): qty 1

## 2014-12-30 MED ORDER — SODIUM CHLORIDE 0.9 % IV SOLN
INTRAVENOUS | Status: DC
  Administered 2014-12-30 (×2): via INTRAVENOUS

## 2014-12-30 MED ORDER — HEPARIN (PORCINE) IN NACL 100-0.45 UNIT/ML-% IJ SOLN
INTRAMUSCULAR | Status: AC
  Filled 2014-12-30: qty 250

## 2014-12-30 MED ORDER — BENAZEPRIL-HYDROCHLOROTHIAZIDE 20-25 MG PO TABS: 1.0000 | ORAL_TABLET | Freq: Every day | ORAL | Status: DC

## 2014-12-30 MED ORDER — SODIUM CHLORIDE 0.9 % IV SOLN: INTRAVENOUS | Status: DC

## 2014-12-30 NOTE — Progress Notes (Signed)
CRITICAL VALUE ALERT  Critical value received:  Troponin 5.29 Date of notification:  12/30/14  Time of notification:  1855  Critical value read back:Yes.    Nurse who received alert:  Wilfred Curtis   MD notified (1st page):  [place], [place]  Time of first page:  1900  MD notified (2nd page):  Time of second page:  Responding MD:  [id number]  Time MD responded: Lisabeth Devoid, [place]

## 2014-12-30 NOTE — CV Procedure (Signed)
[name] is a 59 y.o. female    [id number]  [id number] LOCATION:  FACILITY: [facility]  PHYSICIAN: Troy Sine, MD, [facility] 1956-10-17   DATE OF PROCEDURE:  12/30/2014     EMERGENT CARDIAC CATHETERIZATION/PERCUTANEOUS CORONARY INTERVENTION     HISTORY:    [name] is a 59 y.o. female who has a history of hypertension, hyperlipidemia, and known CAD.  She underwent tandem stenting of her proximal to mid LAD in July 2013 by Dr. Ellyn Hack with rescue PTCA of a jailed diagonal vessel. She has been relatively stable since until today when she developed recurrent chest pain of similar quality aproximately 10:30 AM. She presented to [facility] emergency room were ECG showed inferior ST segment elevation. An inferior STEMI was called and she was transported acutely from [facility] emergency room to the [facility] catheterization laboratory for acute cardiac catheterization/possible PCI.   PROCEDURE: Left heart catheterization: coronary angiography, left ventriculography, percutaneous pinning intervention of a subtotally occluded distal are immediately after the PDA takeoff with PTCA/DES stenting.  Upon arrival to the catheterization laboratory, the patient had residual 4/10 chest pain.  She was premedicated with Versed and fentanyl.  Her right groin was prepped and shaved in usual sterile fashion. Xylocaine 1% was used for local anesthesia. A 6 French sheath was inserted into the R femoral artery. Diagnostic catheterizatiion was done with 6 [place] FL4, FR4 catheters.  The demonstration of a subtotal distal RCA immediately after the takeoff of the PDA vessel rhombus.  The decision was made to perform PCI.  A 6 French shepherd's crook right guiding catheter was used for the interventional procedure.  Angiomax bolus plus infusion was administered.  Brilinta 180 mg was given orally.  A Choice PT moderate support wire was used to navigate tortuous RCA and was able to cross the subtotal stenosis.  A 2.012 mm  Emerge balloon was used for initial dilatation at 8, 10, and 11 atm.  With reperfusion.  She did develop bradycardia with heart rates in the upper 40s.  She was given 0.5 mg of atropine intravenously.  A Xience Alpine 2.7518 mm stent was able to navigate through the tortuous RCA.  There was careful position so as not to the PDA vessel.  The stent was dilated at 16 and 17 atm.  An  Euphora 3.2515 mm balloon was used for post stent dilatation at 10-11 atm up to 3.14 mm. Scout angiography confirmed an excellent angiographic result with resolution of prior thrombus and brisk TIMI-3 flow. A 6 French pigtail catheter was used for left ventriculography.  The arterial sheath was sutured in place with plans to continue bivalirudin 4 hours post procedure.  The patient left the catheterization laboratory chest pain-free with stable hemodynamics.  HEMODYNAMICS:   Central Aorta: 120/84   Left Ventricle: 120/20  ANGIOGRAPHY:  Left main: Angiographically normal with smooth distal taper and bifurcated into an LAD and circumflex vessel.  LAD: Moderate size vessel that had tandem placed stents in the proximal to midsegment which encompassed the takeoff of both the first and second diagonal branches.  There was mild smooth in-stent narrowing of 30% after the first diagonal takeoff within the stented LAD segment.  The distal LAD extended to the apex.  It was free of significant disease.  Left circumflex: Small caliber vessel with an upward superior take off which was free of significant disease.  Right coronary artery: Large dominant vessel that had a shepherd's crook takeoff and vessel tortuosity.  There was smooth  30-40% narrowing in the mid RCA.  Mild calcification in the region of the acute margin.  Immediately beyond the PDA takeoff.  The distal RCA continuation branch had 95-99% stenosis with evidence for thrombus.  The vessel supplied a moderate-sized PLA vessel.  Following percutaneous coronary  intervention to the RCA.  The distal RCA lesion was treated with PTCA, stenting with a Xience  Alpine 2.75 x 18 mm DES stent which was postdilated to 3.14 mm.  The 95-99% stenosis was reduced to 0%.  There was complete resolution of prior clot.  There was TIMI-3 flow.   Left ventriculography revealed low normal LV function with an ejection fraction of 50-55% with mild mid inferior hypocontractility.   Door to balloon time: 25 minutes  IMPRESSION:  Acute ST segment elevation inferior wall myocardial infarction secondary to subtotal stenosis with thrombus of the distal RCA beyond the PDA.  Two-vessel CAD with a patent tandem LAD stents in the proximal to mid segment with smooth 30% narrowing in the stented segment after the first diagonal takeoff; normal left circumflex coronary artery, and RCA with 30-40% mid stenosis and 95-99% distal stenosis with thrombus after the PDA takeoff.  Successful PCI to the subtotal distal RCA stenosis treated with PTCA, insertion of a Xience  Alpine 2.75 x 18 mm DES stent  postdilated to 3.14 mm with stenosis being reduced to 0%, resolution of prior clot and resumption of brisk TIMI-3 flow.  Angiomax/Brilinta/IC nitroglycerin administration.  Door to balloon time: 25 minutes  RECOMMENDATION:  Right of the significant thrombus noted.  Initially, Angiomax will be continued for 4 hours post procedure.  Dual antiplatelet therapy will be given for minimum of a year and most likely indefinitely.  The patient has an allergy to atorvastatin.  Other aggressive lipid-lowering therapy will be necessary.  She will also be started on ace inhibition/beta blocker therapy as blood pressure and heart rate allow.    Troy Sine, MD, [facility] 12/30/2014 4:02 PM

## 2014-12-30 NOTE — ED Notes (Addendum)
Per EMS, pt reports chest pain onset at 1030. Pt reports mid sternal chest pain with intermittent coughing/dry heaving. Pt admin two nitro's at home and ems admin 1 nitro en route. Pt has had 324 mg aspirin prior to arrival. Mild dyspnea noted in triage. Pt reports chest pain is worse with movement. Pt non-diaphoretic. nad noted.

## 2014-12-30 NOTE — ED Notes (Signed)
Pt placed on zoll defib pads per verbal order from EDP.

## 2014-12-30 NOTE — H&P (Signed)
Patient ID: [name] MRN: [id number], DOB/AGE: March 13, 1956   Admit date: 12/30/2014   Primary Physician: Alice Reichert, MD Primary Cardiologist: Dr. [name]  Pt. Profile:  59 year old African-American female with history of hypertension, history of ischemic cardiomyopathy and a history of coronary artery disease status post PCI of LAD in July 2013 transferred from [facility] for inferior STEMI  Problem List  Past Medical History  Diagnosis Date  . Hypertension   . Coronary artery disease 06/2012    s/p NSTEMI with 100% LAD occlusion -- 2 Overlappin Xience DES Stents (2.5 mm x 28, 3.0 mm 18 mm)  . Ischemic cardiomyopathy 06/2012    EF ~40-45%    Past Surgical History  Procedure Laterality Date  . Knee surgery    . Cardiac catheterization    . Coronary stent placement  07/15/12  . Left heart catheterization with coronary angiogram N/A 07/13/2012    Procedure: LEFT HEART CATHETERIZATION WITH CORONARY ANGIOGRAM;  Surgeon: Marykay Lex, MD;  Location: [facility] CATH LAB;  Service: Cardiovascular;  Laterality: N/A;  . Percutaneous coronary stent intervention (pci-s)  07/13/2012    Procedure: PERCUTANEOUS CORONARY STENT INTERVENTION (PCI-S);  Surgeon: Marykay Lex, MD;  Location: [facility] CATH LAB;  Service: Cardiovascular;;  . Left heart catheterization with coronary angiogram N/A 11/20/2012    Procedure: LEFT HEART CATHETERIZATION WITH CORONARY ANGIOGRAM;  Surgeon: Marykay Lex, MD;  Location: [facility] CATH LAB;  Service: Cardiovascular;  Laterality: N/A;     Allergies  Allergies  Allergen Reactions  . Lipitor [Atorvastatin] Other (See Comments)    Severe bone aches    HPI  The patient is a 59 year old African-American female with history of hypertension, history of ischemic cardiomyopathy and a history of coronary artery disease status post PCI of LAD in July 2013. Patient is currently being transferred from [facility] hospital to [facility] as a code STEMI for emergent  cardiac catheterization. Patient had substernal chest pain started roughly around 10 AM this morning. She denies any shortness of breath, however he endorsed diaphoresis, nausea. She denies any recent bleeding problem or any upcoming surgeries.  She was given 2 baby aspirin at home and had 2 more by EMS. She was also given morphine for chest pain. On arrival to [facility], her chest pain has significantly improved. Laboratory finding include troponin of 0.07. Potassium of 2.9, repeat labs shows potassium is 4.0. Creatinine 0.77. Hemoglobin 11.9. Chest x-ray shows no acute findings. EKG shows ST elevation in inferior lead. Patient is to undergo emergent cardiac catheterization.  Home Medications  Prior to Admission medications   Medication Sig Start Date End Date Taking? Authorizing Provider  aspirin EC 81 MG tablet Take 81 mg by mouth every morning.    Historical Provider, MD  Aspirin-Acetaminophen (GOODYS BODY PAIN PO) Take 1 packet by mouth 2 (two) times daily as needed (Pain).     Historical Provider, MD  benazepril-hydrochlorthiazide (LOTENSIN HCT) 20-25 MG per tablet Take 1 tablet by mouth daily. <please make appointment with cardiologist for refills> 08/25/14   Chrystie Nose, MD  cyclobenzaprine (FLEXERIL) 10 MG tablet Take 1 tablet (10 mg total) by mouth 2 (two) times daily as needed for muscle spasms. 10/21/14   Donnetta Hutching, MD  diazepam (VALIUM) 5 MG tablet Take 5 mg by mouth at bedtime as needed for anxiety (for sleep).     Historical Provider, MD  ibuprofen (ADVIL,MOTRIN) 800 MG tablet Take 800 mg by mouth daily as needed for  moderate pain.     Historical Provider, MD  naproxen (NAPROSYN) 500 MG tablet Take 1 tablet (500 mg total) by mouth 2 (two) times daily. 10/21/14   Donnetta Hutching, MD  nitroGLYCERIN (NITROSTAT) 0.4 MG SL tablet Place 1 tablet (0.4 mg total) under the tongue every 5 (five) minutes as needed. For chest pain. 12/03/13   Marykay Lex, MD  Omega-3 Fatty Acids (FISH  OIL PO) Take 1 capsule by mouth daily.    Historical Provider, MD  oxyCODONE-acetaminophen (PERCOCET) 7.5-325 MG per tablet Take 1 tablet by mouth 4 (four) times daily as needed for pain.    Historical Provider, MD  rosuvastatin (CRESTOR) 5 MG tablet Take 5 mg by mouth at bedtime.     Historical Provider, MD    Family History  Family History  Problem Relation Age of Onset  . Hypertension Mother   . Hypertension Father   . Hypertension Sister   . Hypertension Brother   . Coronary artery disease Brother 29    Social History  History   Social History  . Marital Status: Married    Spouse Name: N/A    Number of Children: N/A  . Years of Education: N/A   Occupational History  . Not on file.   Social History Main Topics  . Smoking status: Current Every Day Smoker -- 1.00 packs/day for 20 years  . Smokeless tobacco: Not on file  . Alcohol Use: No  . Drug Use: Not on file  . Sexual Activity: Not on file   Other Topics Concern  . Not on file   Social History Narrative     Review of Systems General:  No chills, fever, night sweats or weight changes.  Cardiovascular:  No dyspnea on exertion, edema, orthopnea, palpitations, paroxysmal nocturnal dyspnea. Chest pain started around 10am, now eased off. No significant SOB Dermatological: No rash, lesions/masses Respiratory: No cough, dyspnea Urologic: No hematuria, dysuria Abdominal:   No diarrhea, bright red blood per rectum, melena, or hematemesis. Nausea Neurologic:  No visual changes, wkns, changes in mental status. All other systems reviewed and are otherwise negative except as noted above.  Physical Exam  Blood pressure 160/100, pulse 69, temperature 97.6 F (36.4 C), temperature source Oral, resp. rate 20, height  (1.626 m), weight 240 lb (108.863 kg), SpO2 98 %.  General: Pleasant, NAD Psych: Normal affect. Neuro: Alert and oriented X 3. Moves all extremities spontaneously. HEENT: Normal  Neck: Supple without  bruits or JVD. Lungs:  Resp regular and unlabored, anterior exam CTA. Heart: RRR no s3, s4, or murmurs. Abdomen: Soft, non-tender, non-distended, BS + x 4.  Extremities: No clubbing, cyanosis or edema. DP/PT/Radials 2+ and equal bilaterally.  Labs  Troponin [facility] Test)  Recent Labs  12/30/14 1344  TROPIPOC 0.04    Recent Labs  12/30/14 1338  TROPONINI 0.07*   Lab Results  Component Value Date   WBC 5.4 12/30/2014   HGB 14.6 12/30/2014   HCT 43.0 12/30/2014   MCV 87.1 12/30/2014   PLT 260 12/30/2014    Recent Labs Lab 12/30/14 1338 12/30/14 1347  NA 137 138  K 2.9* 4.0  CL 105 105  CO2 24  --   BUN 13 15  CREATININE 0.77 0.80  CALCIUM 8.3*  --   GLUCOSE 127* 127*   Lab Results  Component Value Date   CHOL 165 07/13/2012   HDL 68 07/13/2012   LDLCALC 88 07/13/2012   TRIG 43 07/13/2012   No  results found for: DDIMER   Radiology/Studies  Dg Chest Port 1 View  12/30/2014   CLINICAL DATA:  Chest pain beginning this morning. Also having intermittent coughing and dry heaving.  EXAM: PORTABLE CHEST - 1 VIEW  COMPARISON:  10/21/2014  FINDINGS: The cardiac silhouette, mediastinal and hilar contours are within normal limits given the AP projection. There is mild tortuosity and calcification of the thoracic aorta. The lungs are clear. No pleural effusion. The bony thorax is intact.  IMPRESSION: No acute cardiopulmonary findings.   Electronically Signed   By: Loralie Champagne M.D.   On: 12/30/2014 13:38    ECG  Inferior ST elevation   ASSESSMENT AND PLAN  1. Inferior STEMI  - pending emergent PCI  - admit to ICU afterward. Dr Tresa Endo to decide on whether to obtain Echo base on cath finding  - no contraindication to DES (denies upcoming surgery, bleeding or h/o stroke)  2. HTN 3. H/o ICM   Signed, Azalee Course, PA-C 12/30/2014, 2:40 PM   Patient seen and examined. Agree with assessment and plan.  Cheryn Lundquist is a 59 year old African-American female was a  history of hypertension and in July 2013 underwent stenting of her LAD by Dr. [name] with insertion of 2 overlapping Xience stents and rescue PTCA of a jailed diagonal vessel.  She has been relatively stable since until today when she developed recurrent chest pain of similar quality aproximately 10:30 AM.  She presented to [facility] emergency room were ECG showed inferior ST segment elevation.  An inferior STEMI was called and she was transported acutely from [facility] emergency room to the [facility] catheterization laboratory for acute cardiac catheterization and probable need for intervention.  [facility] emergency room.  She was given a heparin bolus and was maintained on a heparin drip.  Upon arrival to the catheterization laboratory.  Her chest pain has improved but is still 3-4/10.  ECG confirmed early inferior STEMI.  Plan is for emergent cardiac catheterization.   Lennette Bihari, MD, [facility] 12/30/2014 3:48 PM

## 2014-12-30 NOTE — ED Provider Notes (Signed)
CSN: [id number]     Arrival date & time 12/30/14  1312 History  This chart was scribed for Enid Skeens, MD by Ronney Lion, ED Scribe. This patient was seen in room APA09/APA09 and the patient's care was started at 1:25 PM.    Chief Complaint  Patient presents with  . Chest Pain   Patient is a 59 y.o. female presenting with chest pain. The history is provided by the patient and medical records. No language interpreter was used.  Chest Pain Chest pain location: mid sternal, per nursing notes. Pain quality: pressure   Pain severity:  Severe Onset quality:  Gradual Duration:  4 hours Timing:  Constant Progression:  Worsening Chronicity:  New Worsened by:  Movement (per nursing notes) Ineffective treatments:  Aspirin Associated symptoms: diaphoresis, nausea and vomiting   Associated symptoms: no abdominal pain     HPI Comments: [name] is a 59 y.o. female with a history of MI and one stent placed shortly after 2-3 years ago who presents to the Emergency Department by ambulance complaining of gradually worsening, initially intermittent though now constant, heavy chest pains that began 3.5 hours ago. Patient reports she took 2 baby aspirins at home, and was given another 2 by EMS. She endorses associated diaphoresis, nausea, and vomiting. She also mentions some recent sinus discomfort. Patient states this is a new pain. She denies abdominal pain, melena, and hematochezia. Dr. Susette Racer is her cardiologist. Patient denies known allergies to blood thinners.   Past Medical History  Diagnosis Date  . Hypertension   . Coronary artery disease 06/2012    s/p NSTEMI with 100% LAD occlusion -- 2 Overlappin Xience DES Stents (2.5 mm x 28, 3.0 mm 18 mm)  . Ischemic cardiomyopathy 06/2012    EF ~40-45%  . Arthritis    Past Surgical History  Procedure Laterality Date  . Knee surgery    . Cardiac catheterization    . Coronary stent placement  07/15/12  . Left heart catheterization with coronary  angiogram N/A 07/13/2012    Procedure: LEFT HEART CATHETERIZATION WITH CORONARY ANGIOGRAM;  Surgeon: Marykay Lex, MD;  Location: [facility] CATH LAB;  Service: Cardiovascular;  Laterality: N/A;  . Percutaneous coronary stent intervention (pci-s)  07/13/2012    Procedure: PERCUTANEOUS CORONARY STENT INTERVENTION (PCI-S);  Surgeon: Marykay Lex, MD;  Location: [facility] CATH LAB;  Service: Cardiovascular;;  . Left heart catheterization with coronary angiogram N/A 11/20/2012    Procedure: LEFT HEART CATHETERIZATION WITH CORONARY ANGIOGRAM;  Surgeon: Marykay Lex, MD;  Location: [facility] CATH LAB;  Service: Cardiovascular;  Laterality: N/A;  . Cholecystectomy     Family History  Problem Relation Age of Onset  . Hypertension Mother   . Hypertension Father   . Hypertension Sister   . Hypertension Brother   . Coronary artery disease Brother 53   History  Substance Use Topics  . Smoking status: Current Every Day Smoker -- 0.50 packs/day for 20 years    Types: Cigarettes  . Smokeless tobacco: Not on file  . Alcohol Use: No   OB History    No data available     Review of Systems  Constitutional: Positive for diaphoresis and appetite change.  HENT: Positive for sinus pressure.   Cardiovascular: Positive for chest pain. Negative for leg swelling.  Gastrointestinal: Positive for nausea and vomiting. Negative for abdominal pain and blood in stool.  All other systems reviewed and are negative.   Allergies  Lipitor  Home Medications  Prior to Admission medications   Medication Sig Start Date End Date Taking? Authorizing Provider  aspirin EC 81 MG tablet Take 81 mg by mouth daily.    Yes Historical Provider, MD  Aspirin-Acetaminophen (GOODYS BODY PAIN PO) Take 1 packet by mouth 2 (two) times daily as needed (Pain).    Yes Historical Provider, MD  b complex vitamins tablet Take 1 tablet by mouth daily.   Yes Historical Provider, MD  benazepril-hydrochlorthiazide (LOTENSIN HCT) 20-25 MG per tablet Take  1 tablet by mouth daily. <please make appointment with cardiologist for refills> 08/25/14  Yes Chrystie Nose, MD  benzonatate (TESSALON) 200 MG capsule Take 200 mg by mouth every 4 (four) hours as needed for cough.   Yes Historical Provider, MD  cefdinir (OMNICEF) 300 MG capsule Take 300 mg by mouth 2 (two) times daily. 7 day course started 12/18/2014   Yes Historical Provider, MD  diazepam (VALIUM) 5 MG tablet Take 5 mg by mouth at bedtime as needed (for sleep).    Yes Historical Provider, MD  ibuprofen (ADVIL,MOTRIN) 800 MG tablet Take 800 mg by mouth daily as needed for moderate pain.    Yes Historical Provider, MD  meloxicam (MOBIC) 15 MG tablet Take 15 mg by mouth daily as needed for pain.    Yes Historical Provider, MD  naproxen (NAPROSYN) 500 MG tablet Take 1 tablet (500 mg total) by mouth 2 (two) times daily. Patient taking differently: Take 500 mg by mouth 2 (two) times daily as needed (pain).  10/21/14  Yes Donnetta Hutching, MD  nitroGLYCERIN (NITROSTAT) 0.4 MG SL tablet Place 1 tablet (0.4 mg total) under the tongue every 5 (five) minutes as needed. For chest pain. Patient taking differently: Place 0.4 mg under the tongue every 5 (five) minutes as needed for chest pain.  12/03/13  Yes Marykay Lex, MD  Omega-3 Fatty Acids (FISH OIL PO) Take 1 capsule by mouth daily.   Yes Historical Provider, MD  oxyCODONE-acetaminophen (PERCOCET) 7.5-325 MG per tablet Take 1 tablet by mouth 4 (four) times daily as needed for pain.   Yes Historical Provider, MD  rosuvastatin (CRESTOR) 5 MG tablet Take 5 mg by mouth at bedtime.    Yes Historical Provider, MD   BP 113/73 mmHg  Pulse 62  Temp(Src) 97.7 F (36.5 C) (Oral)  Resp 14  Ht 5\' 6"  (1.676 m)  Wt 240 lb 4.8 oz (109 kg)  BMI 38.80 kg/m2  SpO2 100% Physical Exam  Constitutional: She is oriented to person, place, and time. She appears well-developed and well-nourished. No distress.  HENT:  Head: Normocephalic and atraumatic.  Mouth/Throat:  Oropharynx is clear and moist.  Moist mucus membranes.  Eyes: Conjunctivae and EOM are normal. Pupils are equal, round, and reactive to light.  Neck: Neck supple. No tracheal deviation present.  Cardiovascular: Normal rate and regular rhythm.   Pulmonary/Chest: Effort normal. No respiratory distress.  Few crackles at the bases.  Musculoskeletal: Normal range of motion.  Neurological: She is alert and oriented to person, place, and time.  Skin: Skin is warm and dry.  Psychiatric: She has a normal mood and affect. Her behavior is normal.  Nursing note and vitals reviewed.   ED Course  Procedures (including critical care time)   EMERGENCY DEPARTMENT [place] CARDIAC EXAM "Study: Limited Ultrasound of the heart and pericardium"  INDICATIONS: chest pain, stemi Multiple views of the heart and pericardium were obtained in real-time with a multi-frequency probe.  PERFORMED ZO:XWRUEA  IMAGES ARCHIVED?: Yes  FINDINGS: No pericardial effusion, Normal contractility and Tamponade physiology absent  LIMITATIONS:  Body habitus  VIEWS USED: Parasternal long axis, Parasternal short axis and Apical 4 chamber   INTERPRETATION: Cardiac activity present, Pericardial effusioin absent and Cardiac tamponade absent wall motion abnormality inferior wall  CRITICAL CARE Performed by: Enid Skeens   Total critical care time: 35 min  Critical care time was exclusive of separately billable procedures and treating other patients.  Critical care was necessary to treat or prevent imminent or life-threatening deterioration.  Critical care was time spent personally by me on the following activities: development of treatment plan with patient and/or surrogate as well as nursing, discussions with consultants, evaluation of patient's response to treatment, examination of patient, obtaining history from patient or surrogate, ordering and performing treatments and interventions, ordering and review of laboratory  studies, ordering and review of radiographic studies, pulse oximetry and re-evaluation of patient's condition.  DIAGNOSTIC STUDIES: Oxygen Saturation is 96% on room air, normal by my interpretation.    COORDINATION OF CARE: 1:29 PM - Discussed treatment plan with pt at bedside which includes transfer to [facility], Heparin, and Morphine, and pt agreed to plan.   Labs Review Labs Reviewed  CBC - Abnormal; Notable for the following:    Hemoglobin 11.9 (*)    All other components within normal limits  BASIC METABOLIC PANEL - Abnormal; Notable for the following:    Potassium 2.9 (*)    Glucose, Bld 127 (*)    Calcium 8.3 (*)    All other components within normal limits  TROPONIN I - Abnormal; Notable for the following:    Troponin I 0.07 (*)    All other components within normal limits  TROPONIN I - Abnormal; Notable for the following:    Troponin I 5.29 (*)    All other components within normal limits  I-STAT CHEM 8, ED - Abnormal; Notable for the following:    Glucose, Bld 127 (*)    Calcium, Ion 1.03 (*)    All other components within normal limits  MRSA PCR SCREENING  PROTIME-INR  APTT  TROPONIN I  TROPONIN I  BASIC METABOLIC PANEL  CBC  I-STAT TROPOININ, ED    Imaging Review Dg Chest Port 1 View  12/30/2014   CLINICAL DATA:  Chest pain beginning this morning. Also having intermittent coughing and dry heaving.  EXAM: PORTABLE CHEST - 1 VIEW  COMPARISON:  10/21/2014  FINDINGS: The cardiac silhouette, mediastinal and hilar contours are within normal limits given the AP projection. There is mild tortuosity and calcification of the thoracic aorta. The lungs are clear. No pleural effusion. The bony thorax is intact.  IMPRESSION: No acute cardiopulmonary findings.   Electronically Signed   By: Loralie Champagne M.D.   On: 12/30/2014 13:38     EKG Interpretation   Date/Time:  Friday December 30 2014 13:19:49 EST Ventricular Rate:  72 PR Interval:  148 QRS Duration:  94 QT Interval:  346 QTC Calculation: 379 R Axis:   40 Text Interpretation:  Sinus rhythm Inferior infarct, acute (RCA) Probable  RV involvement, suggest recording right precordial leads Confirmed by  Kainan Patty  MD, Aliyana Dlugosz ([id number]) on 12/30/2014 1:26:21 PM       EKG Reviewed and Appears Consistent with STEMI   MDM   Final diagnoses:  Acute ST elevation myocardial infarction (STEMI) involving other coronary artery of inferior wall  Acute chest pain   I personally performed the services described in this documentation, which was  scribed in my presence. The recorded information has been reviewed and is accurate.  EKG reviewed shortly after arrival, concerning for acute inferior infarction. Discussed history of present illness with patient and she does have coronary artery disease and is having active chest pressure and diaphoresis. Code STEMI called immediately, discussed the case with cardiology who agreed with transfer for emergent cath, updated husband and patient. Reassessment persistent 5 out of 10 pain, repeat morphine ordered, heparin given.  The patients results and plan were reviewed and discussed.   Any x-rays performed were personally reviewed by myself.   Differential diagnosis were considered with the presenting HPI.  Medications  0.9 %  sodium chloride infusion ( Intravenous Duplicate 12/30/14 1345)  cyclobenzaprine (FLEXERIL) tablet 10 mg (not administered)  rosuvastatin (CRESTOR) tablet 5 mg (not administered)  omega-3 acid ethyl esters (LOVAZA) capsule 1 g (1 g Oral Given 12/30/14 1812)  diazepam (VALIUM) tablet 5 mg (not administered)  aspirin chewable tablet 324 mg (324 mg Oral Not Given 12/30/14 1700)    Or  aspirin suppository 300 mg ( Rectal See Alternative 12/30/14 1700)  nitroGLYCERIN (NITROSTAT) SL tablet 0.4 mg (not administered)  acetaminophen (TYLENOL) tablet 650 mg (not administered)  ondansetron (ZOFRAN) injection 4 mg (not administered)  0.9 %  sodium chloride  infusion ( Intravenous New Bag/Given 12/30/14 1704)  morphine 2 MG/ML injection 2 mg (not administered)  aspirin EC tablet 81 mg (not administered)  ticagrelor (BRILINTA) tablet 90 mg (not administered)  pneumococcal 23 valent vaccine (PNU-IMMUNE) injection 0.5 mL (not administered)  oxyCODONE-acetaminophen (PERCOCET/ROXICET) 5-325 MG per tablet 1 tablet (not administered)    And  oxyCODONE (Oxy IR/ROXICODONE) immediate release tablet 2.5 mg (not administered)  benazepril (LOTENSIN) tablet 20 mg (not administered)    And  hydrochlorothiazide (HYDRODIURIL) tablet 25 mg (not administered)  bivalirudin (ANGIOMAX) 250 mg in sodium chloride 0.9 % 50 mL (5 mg/mL) infusion (0.25 mg/kg/hr  108.9 kg Intravenous Transfusing/Transfer 12/30/14 1745)  morphine 4 MG/ML injection 4 mg (4 mg Intravenous Given 12/30/14 1335)  0.9 %  sodium chloride infusion ( Intravenous New Bag/Given 12/30/14 1344)  heparin injection 4,000 Units (4,000 Units Intravenous Given 12/30/14 1339)  morphine 4 MG/ML injection 4 mg (4 mg Intravenous Given 12/30/14 1357)    Filed Vitals:   12/30/14 1715 12/30/14 1730 12/30/14 1800 12/30/14 1900  BP: 130/89 132/93 137/92 113/73  Pulse: 85 62 68 62  Temp:      TempSrc:      Resp: Height:      Weight:      SpO2: 94% 96% 95% 100%    Final diagnoses:  Acute ST elevation myocardial infarction (STEMI) involving other coronary artery of inferior wall  Acute chest pain    Admission/ observation were discussed with the admitting physician, patient and/or family and they are comfortable with the plan.     Enid Skeens, MD 12/30/14 [phone]

## 2014-12-30 NOTE — Progress Notes (Signed)
Sheath located in right groin.  Pedal pulse 2+ Patient educated prior to sheath removal. pressure held for 25 minutes, started at 2238 ended at 2303. patient vital signs stable during sheath pull & patient tolerated sheath pull well.  post sheath removal groin site level 0 post sheath removal, pedal pulse 2+. post cath instructions given to patient, patient verbalized understanding in her own words pressure dressing applied  Ivery Quale, RN

## 2014-12-30 NOTE — ED Notes (Signed)
EDP at bedside  

## 2014-12-30 NOTE — Progress Notes (Signed)
eLink Physician-Brief Progress Note Patient Name: [name] DOB: 31-May-1956 MRN: [id number]   Date of Service  12/30/2014  HPI/Events of Note  New pt eval MI, stent good hemodynamics No aryrthmia  eICU Interventions  No changes to care plan     Intervention Category Evaluation Type: New Patient Evaluation  Nelda Bucks. 12/30/2014, 4:58 PM

## 2014-12-30 NOTE — ED Notes (Signed)
Heparin infusion started at 24ml/hr per pharmacy at 1345.

## 2014-12-31 DIAGNOSIS — Z720 Tobacco use: Secondary | ICD-10-CM

## 2014-12-31 DIAGNOSIS — I2119 ST elevation (STEMI) myocardial infarction involving other coronary artery of inferior wall: Secondary | ICD-10-CM | POA: Diagnosis not present

## 2014-12-31 DIAGNOSIS — I10 Essential (primary) hypertension: Secondary | ICD-10-CM

## 2014-12-31 LAB — TROPONIN I
TROPONIN I: 33.03 ng/mL — AB (ref <0.031)
TROPONIN I: 26.33 ng/mL — AB (ref <0.031)

## 2014-12-31 LAB — BASIC METABOLIC PANEL
Anion gap: 7 (ref 5–15)
BUN: 10 mg/dL (ref 6–23)
CALCIUM: 8.5 mg/dL (ref 8.4–10.5)
CHLORIDE: 110 meq/L (ref 96–112)
CO2: 23 mmol/L (ref 19–32)
Creatinine, Ser: 0.79 mg/dL (ref 0.50–1.10)
GFR calc Af Amer: >90 mL/min (ref >90)
GLUCOSE: 99 mg/dL (ref 70–99)
Potassium: 3.4 mmol/L — ABNORMAL LOW (ref 3.5–5.1)
Sodium: 140 mmol/L (ref 135–145)

## 2014-12-31 LAB — CBC
HEMATOCRIT: 36.3 % (ref 36.0–46.0)
Hemoglobin: 11.7 g/dL — ABNORMAL LOW (ref 12.0–15.0)
MCH: 27.9 pg (ref 26.0–34.0)
MCHC: 32.2 g/dL (ref 30.0–36.0)
MCV: 86.6 fL (ref 78.0–100.0)
Platelets: 240 10*3/uL (ref 150–400)
RBC: 4.19 MIL/uL (ref 3.87–5.11)
RDW: 14.6 % (ref 11.5–15.5)
WBC: 4.8 10*3/uL (ref 4.0–10.5)

## 2014-12-31 MED ORDER — CARVEDILOL 6.25 MG PO TABS
6.2500 mg | ORAL_TABLET | Freq: Two times a day (BID) | ORAL | Status: DC
  Administered 2014-12-31 – 2015-01-01 (×3): 6.25 mg via ORAL
  Filled 2014-12-31 (×5): qty 1

## 2014-12-31 NOTE — Progress Notes (Signed)
CARDIAC REHAB PHASE I   PRE:  Rate/Rhythm: 79 SR  BP:  Supine:   Sitting: 148/88  Standing:    SaO2:   MODE:  Ambulation: 350 ft   POST:  Rate/Rhythm: 94 SR  BP:  Supine:   Sitting: 150/109, 160/100  Standing:     SaO2:  0925-1017  Pt walked 350 ft independently with steady gait. Tolerated well. No CP. MI education completed with pt who voiced understanding. Gave pt stent card and brilinta booklet and stressed importance of taking. Reviewed NTG use. Discussed smoking cessation and gave handouts and fake cigarette. Pt plans to quit cold [place]. BP elevated and cardiologist aware. Discussed CRP 2 and pt declined as she stated she attended before and wants to do Silver Sneakers now. Gave ex ed and heart healthy diet. Encouraged pt to watch sodium with high BP.   Luetta Nutting, RN BSN  12/31/2014 10:12 AM

## 2014-12-31 NOTE — Progress Notes (Signed)
Patient Name: [name]      SUBJECTIVE admitted with IMI percutaneous pinning intervention of a subtotally occluded distal are immediately after the PDA takeoff with PTCA/DES stenting.  EF 50-55%   59 y.o. female who has a history of hypertension, hyperlipidemia, and known CAD. She underwent tandem stenting of her proximal to mid LAD in July 2013 by Dr. [name] with rescue PTCA of a jailed diagonal vessel. She has been relatively stable since until today when she developed recurrent chest pain of similar quality aproximately 10:30 AM. She presented to [facility] emergency room were ECG showed inferior ST segment elevation.    Has hx of hypertension  Feels well at this point  Past Medical History  Diagnosis Date  . Hypertension   . Coronary artery disease 06/2012    s/p NSTEMI with 100% LAD occlusion -- 2 Overlappin Xience DES Stents (2.5 mm x 28, 3.0 mm 18 mm)  . Ischemic cardiomyopathy 06/2012    EF ~40-45%  . Arthritis     Scheduled Meds:  Scheduled Meds: . aspirin  324 mg Oral NOW   Or  . aspirin  300 mg Rectal NOW  . aspirin EC  81 mg Oral Daily  . benazepril  20 mg Oral Daily   And  . hydrochlorothiazide  25 mg Oral Daily  . omega-3 acid ethyl esters  1 g Oral Daily  . pneumococcal 23 valent vaccine  0.5 mL Intramuscular Tomorrow-1000  . rosuvastatin  5 mg Oral QHS  . ticagrelor  90 mg Oral BID   Continuous Infusions: . sodium chloride Stopped (12/31/14 0730)  . sodium chloride Stopped (12/31/14 0630)   acetaminophen, cyclobenzaprine, diazepam, morphine injection, nitroGLYCERIN, ondansetron (ZOFRAN) IV, oxyCODONE-acetaminophen **AND** oxyCODONE    PHYSICAL EXAM Filed Vitals:   12/31/14 0400 12/31/14 0500 12/31/14 0600 12/31/14 0700  BP: 137/98 148/62 147/107 112/77  Pulse: 61     Temp: 98.5 F (36.9 C)   98 F (36.7 C)  TempSrc: Oral   Oral  Resp: 20   18  Height:      Weight:      SpO2: 100%   98%   Well developed and nourished in no  acute distress HENT normal Neck supple with JVP-flat Clear Regular rate and rhythm, no murmurs or gallops Abd-soft with active BS No Clubbing cyanosis edema Skin-warm and dry A & Oriented  Grossly normal sensory and motor function  TELEMETRY: Reviewed telemetry pt in isnu:    Intake/Output Summary (Last 24 hours) at 12/31/14 1016 Last data filed at 12/31/14 0853  Gross per 24 hour  Intake 2260.84 ml  Output   1525 ml  Net 735.84 ml    LABS: Basic Metabolic Panel:  Recent Labs Lab 12/30/14 1338 12/30/14 1347 12/31/14 0212  NA 137 138 140  K 2.9* 4.0 3.4*  CL 105 105 110  CO2 24  --  23  GLUCOSE 127* 127* 99  BUN CREATININE 0.77 0.80 0.79  CALCIUM 8.3*  --  8.5   Cardiac Enzymes:  Recent Labs  12/30/14 1647 12/30/14 2253 12/31/14 0212  TROPONINI 5.29* 33.03* 26.33*   CBC:  Recent Labs Lab 12/30/14 1338 12/30/14 1347 12/31/14 0212  WBC 5.4  --  4.8  HGB 11.9* 14.6 11.7*  HCT 37.2 43.0 36.3  MCV 87.1  --  86.6  PLT 260  --  240   PROTIME:  Recent Labs  12/30/14 1338  LABPROT 13.3  INR 1.00   Liver Function Tests:    ASSESSMENT AND PLAN:  Principal Problem:   Acute ST elevation myocardial infarction (STEMI) involving other coronary artery of inferior wall Active Problems:   HTN (hypertension)   Tobacco abuse   Ischemic cardiomyopathy, EF 40-45%  Discussed smoking cessation Will add carvedilol for BP and cardiomyopathy  Signed, Sherryl Manges MD  12/31/2014

## 2015-01-01 MED ORDER — ACETAMINOPHEN 325 MG PO TABS: 650.0000 mg | ORAL_TABLET | ORAL | Status: DC | PRN

## 2015-01-01 MED ORDER — NITROGLYCERIN 0.4 MG SL SUBL
0.4000 mg | SUBLINGUAL_TABLET | SUBLINGUAL | Status: DC | PRN
Start: 2015-01-01 — End: 2016-02-29

## 2015-01-01 MED ORDER — CARVEDILOL 6.25 MG PO TABS: 6.2500 mg | ORAL_TABLET | Freq: Two times a day (BID) | ORAL | Status: DC

## 2015-01-01 MED ORDER — TICAGRELOR 90 MG PO TABS: 90.0000 mg | ORAL_TABLET | Freq: Two times a day (BID) | ORAL | Status: DC

## 2015-01-01 NOTE — Progress Notes (Signed)
Pt ambulated in hallway 625ft. NAD sl. SOB tol well denies dizziness, CP, diaphoresis. Back to room sitting on edge of bed.

## 2015-01-01 NOTE — Discharge Instructions (Signed)
Ticagrelor oral tablet What is this medicine? TICAGRELOR (TYE ka GREL or) helps to prevent blood clots. This medicine is used to prevent heart attack, stroke, or other vascular events in people who have had a recent heart attack or who have severe chest pain. This medicine may be used for other purposes; ask your health care provider or pharmacist if you have questions. COMMON BRAND NAME(S): BRILINTA What should I tell my health care provider before I take this medicine? They need to know if you have any of these conditions: -bleeding disorder -bleeding in the brain -liver disease -planned surgery -stomach or intestinal ulcers -stroke or transient ischemic attack -an unusual or allergic reaction to ticagrelor, other medicines, foods, dyes, or preservatives -pregnant or trying to get pregnant -breast-feeding How should I use this medicine? Take this medicine by mouth with a glass of water. Follow the directions on the prescription label. You can take it with or without food. If it upsets your stomach, take it with food. Take your medicine at regular intervals. Do not take it more often than directed. Do not stop taking except on your doctor's advice. Talk to you pediatrician regarding the use of this medicine in children. Special care may be needed. Overdosage: If you think you've taken too much of this medicine contact a poison control center or emergency room at once. Overdosage: If you think you have taken too much of this medicine contact a poison control center or emergency room at once. NOTE: This medicine is only for you. Do not share this medicine with others. What if I miss a dose? If you miss a dose, take it as soon as you can. If it is almost time for your next dose, take only that dose. Do not take double or extra doses. What may interact with this medicine? -certain antibiotics like clarithromycin and telithromycin -certain medicines for fungal infections like itraconazole,  ketoconazole, and voriconazole -certain medicines for HIV infection like atazanavir, indinavir, nelfinavir, ritonavir, and saquinavir -certain medicines for seizures like carbamazepine, phenobarbital, and phenytoin -certain medicines that treat or prevent blood clots like warfarin -dexamethasone -digoxin -lovastatin -nefazodone -rifampin -simvastatin This list may not describe all possible interactions. Give your health care provider a list of all the medicines, herbs, non-prescription drugs, or dietary supplements you use. Also tell them if you smoke, drink alcohol, or use illegal drugs. Some items may interact with your medicine. What should I watch for while using this medicine? Visit your doctor or health care professional for regular check ups. Do not stop taking you medicine unless your doctor tells you to. Notify your doctor or health care professional and seek emergency treatment if you develop breathing problems; changes in vision; chest pain; severe, sudden headache; pain, swelling, warmth in the leg; trouble speaking; sudden numbness or weakness of the face, arm, or leg. These can be signs that your condition has gotten worse. If you are going to have surgery or dental work, tell your doctor or health care professional that you are taking this medicine. You should take aspirin every day with this medicine. Do not take more than 100 mg each day. Talk to your doctor if you have questions. What side effects may I notice from receiving this medicine? Side effects that you should report to your doctor or health care professional as soon as possible: -allergic reactions like skin rash, itching or hives, swelling of the face, lips, or tongue -breathing problems -fast or irregular heartbeat -feeling faint or light-headed, falls -signs and  symptoms of bleeding such as bloody or black, tarry stools; red or dark-brown urine; spitting up blood or brown material that looks like coffee grounds;  red spots on the skin; unusual bruising or bleeding from the eye, gums, or nose Side effects that usually do not require medical attention (Report these to your doctor or health care professional if they continue or are bothersome.): -breast enlargement in both males and females -diarrhea -dizziness -headache -tiredness -upset stomach This list may not describe all possible side effects. Call your doctor for medical advice about side effects. You may report side effects to FDA at 1-800-FDA-1088. Where should I keep my medicine? Keep out of the reach of children. Store at room temperature of 59 to 86 degrees F (15 to 30 degrees C). Throw away any unused medicine after the expiration date. NOTE: This sheet is a summary. It may not cover all possible information. If you have questions about this medicine, talk to your doctor, pharmacist, or health care provider.  2015, [employer]vier/Gold Standard. (2014-03-28 08:31:23) Coronary Angiogram With Stent, Care After Refer to this sheet in the next few weeks. These instructions provide you with information on caring for yourself after your procedure. Your health care provider may also give you more specific instructions. Your treatment has been planned according to current medical practices, but problems sometimes occur. Call your health care provider if you have any problems or questions after your procedure.  WHAT TO EXPECT AFTER THE PROCEDURE  The insertion site may be tender for a few days after your procedure. HOME CARE INSTRUCTIONS   Take medicines only as directed by your health care provider. Blood thinners may be prescribed after your procedure to improve blood flow through the stent.  Change any bandages (dressings) as directed by your health care provider.   Check your insertion site every day for redness, swelling, or fluid leaking from the insertion.   Do not take baths, swim, or use a hot tub until your health care provider approves. You  may shower. Pat the insertion area dry. Do not rub the insertion area with a washcloth or towel.   Eat a heart-healthy diet. This should include plenty of fresh fruits and vegetables. Meat should be lean cuts. Avoid the following types of food:   Food that is high in salt.   Canned or highly processed food.   Food that is high in saturated fat or sugar.   Fried food.   Make any other lifestyle changes recommended by your health care provider. This may include:   Not using any tobacco products including cigarettes, chewing tobacco, or electronic cigarettes.  Managing your weight.   Getting regular exercise.   Managing your blood pressure.   Limiting your alcohol intake.   Managing other health problems, such as diabetes.   If you need an MRI after your heart stent was placed, be sure to tell the health care provider who orders the MRI that you have a heart stent.   Keep all follow-up visits as directed by your health care provider.  SEEK IMMEDIATE MEDICAL CARE IF:   You develop chest pain, shortness of breath, feel faint, or pass out.  You have bleeding, swelling larger than a walnut, or drainage from the catheter insertion site.  You develop pain, discoloration, coldness, or severe bruising in the leg or arm that held the catheter.  You develop bleeding from any other place such as from the bowels. There may be bright red blood in the urine or  stools, or it may appear as black, tarry stools.  You have a fever or chills. MAKE SURE YOU:  Understand these instructions.  Will watch your condition.  Will get help right away if you are not doing well or get worse. Document Released: 07/05/2005 Document Revised: 05/02/2014 Document Reviewed: 05/19/2013 [facility] Patient Information 2015 [place], [place]. This information is not intended to replace advice given to you by your health care provider. Make sure you discuss any questions you have with your health care  provider. Myocardial Infarction A myocardial infarction (MI) is damage to the heart that is not reversible. It is also called a heart attack. An MI usually occurs when a heart (coronary) artery becomes blocked or narrowed. This cuts off the blood supply to the heart. When one or more of the heart (coronary) arteries becomes blocked, that area of the heart begins to die. This causes pain felt during an MI.  If you think you might be having an MI, call your local emergency services immediately (911 in [place] It is recommended that you chew and swallow 3 non-enteric coated baby aspirin if you do not have an aspirin allergy. Do not drive yourself to the hospital or wait to see if your symptoms go away. The sooner MI is treated, the greater the amount of heart muscle saved. Time is muscle. It can save your life. CAUSES  An MI can occur from:  A gradual buildup of a fatty substance called plaque. When plaque builds up in the arteries, this condition is called atherosclerosis. This buildup can block or reduce the blood supply to the heart artery(s).  A sudden plaque rupture within a heart artery that causes a blood clot (thrombus). A blood clot can block the heart artery which does not allow blood flow to the heart.  A severe tightening (spasm) of the heart artery. This is a less common cause of a heart attack. When a heart artery spasms, it cuts off blood flow through the artery. Spasms can occur in heart arteries that do not have atherosclerosis. RISK FACTORS People at risk for an MI usually have one or more risk factors, such as:  High blood pressure.  High cholesterol.  Smoking.  Gender. Men have a higher heart attack risk.  Overweight/obesity.  Age.  Family history.  Lack of exercise.  Diabetes.  Stress.  Excessive alcohol use.  Street drug use (cocaine and methamphetamines). SYMPTOMS  MI symptoms can vary, such as:  In both men and women, MI symptoms can include the  following:  Chest pain. The chest pain may feel like a crushing, squeezing, or "pressure" type feeling. MI pain can be "referred," meaning pain can be caused in one part of the body but felt in another part of the body. Referred MI pain may occur in the left arm, neck, or jaw. Pain may even be felt in the right arm.  Shortness of breath (dyspnea).  Heartburn or indigestion with or without vomiting, shortness of breath, or sweating (diaphoresis).  Sudden, cold sweats.  Sudden lightheadedness.  Upper back pain.  Women can have unique MI symptoms, such as:  Unexplained feelings of nervousness or anxiety.  Discomfort between the shoulder blades (scapula) or upper back.  Tingling in the hands and arms.  In elderly people (regardless of gender), MI symptoms can be subtle, such as:  Sweating (diaphoresis).  Shortness of breath (dyspnea).  General tiredness (fatigue) or not feeling well (malaise). DIAGNOSIS  Diagnosis of an MI involves several tests such as:  An assessment of your vital signs such as heart rhythm, blood pressure, respiratory rate, and oxygen level.  An EKG (ECG) to look at the electrical activity of your heart.  Blood tests called cardiac markers are drawn at scheduled times to measure proteins or enzymes released by the damaged heart muscle.  A chest X-ray.  An echocardiogram to evaluate heart motion and blood flow.  Coronary angiography (cardiac catheterization). This is a diagnostic procedure to look at the heart arteries. TREATMENT  Acute Intervention. For an MI, the national standard in the [place] is to have an acute intervention in under 90 minutes from the time you get to the hospital. An acute intervention is a special procedure to open up the heart arteries. It is done in a treatment room called a "catheterization lab" (cath lab). Some hospitals do no have a cath lab. If you are having an MI and the hospital does not have a cath lab, the standard  is to transport you to a hospital that has one. In the cath lab, acute intervention includes:  Angioplasty. An angioplasty involves inserting a thin, flexible tube (catheter) into an artery in either your groin or wrist. The catheter is threaded to the heart arteries. A balloon at the end of the catheter is inflated to open a narrowed or blocked heart artery. During an angioplasty procedure, a small mesh tube (stent) may be used to keep the heart artery open. Depending on your condition and health history, one of two types of stents may be placed:  Drug-eluting stent (DES). A DES is coated with a medicine to prevent scar tissue from growing over the stent. With drug-eluting stents, blood thinning medicine will need to be taken for up to a year.  Bare metal stent. This type of stent has no special coating to keep tissue from growing over it. This type of stent is used if you cannot take blood thinning medicine for a prolonged time or you need surgery in the near future. After a bare metal stent is placed, blood thinning medicine will need to be taken for about a month.  If you are taking blood thinning medicine (anti-platelet therapy) after stent placement, do not stop taking it unless your caregiver says it is okay to do so. Make sure you understand how long you need to take the medicine. Surgical Intervention  If an acute intervention is not successful, surgery may be needed:  Open heart surgery (coronary artery bypass graft, CABG). CABG takes a vein (saphenous vein) from your leg. The vein is then attached to the blocked heart artery which bypasses the blockage. This then allows blood flow to the heart muscle. Additional Interventions  A "clot buster" medicine (thrombolytic) may be given. This medicine can help break up a clot in the heart artery. This medicine may be given if a person cannot get to a cath lab right away.  Intra-aortic balloon pump (IABP). If you have suffered a very severe MI  and are too unstable to go to the cath lab or to surgery, an IABP may be used. This is a temporary mechanical device used to increase blood flow to the heart and reduce the workload of the heart until you are stable enough to go to the cath lab or surgery. HOME CARE INSTRUCTIONS After an MI, you may need the following:  Medicine. Take medicine as directed by your caregiver. Medicines after an MI may:  Keep your blood from clotting easily (blood thinners).  Control your blood  pressure.  Help lower your cholesterol.  Control abnormal heart rhythms.  Lifestyle changes. Under the guidance of your caregiver, lifestyle changes include:  Quitting smoking, if you smoke. Your caregiver can help you quit.  Being physically active.  Maintaining a healthy weight.  Eating a heart healthy diet. A dietitian can help you learn healthy eating options.  Managing diabetes.  Reducing stress.  Limiting alcohol intake. SEEK IMMEDIATE MEDICAL CARE IF:   You have severe chest pain, especially if the pain is crushing or pressure-like and spreads to the arms, back, neck, or jaw. This is an emergency. Do not wait to see if the pain will go away. Get medical help at once. Call your local emergency services (911 in the [place] Do not drive yourself to the hospital.  You have shortness of breath during rest, sleep, or with activity.  You have sudden sweating or clammy skin.  You feel sick to your stomach (nauseous) and throw up (vomit).  You suddenly become lightheaded or dizzy.  You feel your heart beating rapidly or you notice "skipped" beats. MAKE SURE YOU:   Understand these instructions.  Will watch your condition.  Will get help right away if you are not doing well or get worse. Document Released: 12/16/2005 Document Revised: 12/21/2013 Document Reviewed: 02/18/2014 [facility] Patient Information 2015 [place], [place]. This information is not intended to replace advice given to you by your  health care provider. Make sure you discuss any questions you have with your health care provider.

## 2015-01-01 NOTE — Progress Notes (Signed)
Patient ID: IRAN KIEVIT, female   DOB: 05/29/1956, 59 y.o.   MRN: [id number]       Patient Name: [name]      SUBJECTIVE  Wants to go home no chest pain    58 y.o. female who has a history of hypertension, hyperlipidemia, and known CAD. She underwent tandem stenting of her proximal to mid LAD in July 2013 by Dr. [name] with rescue PTCA of a jailed diagonal vessel. She has been relatively stable since until today when she developed recurrent chest pain of similar quality aproximately 10:30 AM. She presented to [facility] emergency room were ECG showed inferior ST segment elevation.    Has hx of hypertension  Feels well at this point  Past Medical History  Diagnosis Date  . Hypertension   . Coronary artery disease 06/2012    s/p NSTEMI with 100% LAD occlusion -- 2 Overlappin Xience DES Stents (2.5 mm x 28, 3.0 mm 18 mm)  . Ischemic cardiomyopathy 06/2012    EF ~40-45%  . Arthritis     Scheduled Meds:  Scheduled Meds: . aspirin EC  81 mg Oral Daily  . benazepril  20 mg Oral Daily   And  . hydrochlorothiazide  25 mg Oral Daily  . carvedilol  6.25 mg Oral BID WC  . omega-3 acid ethyl esters  1 g Oral Daily  . rosuvastatin  5 mg Oral QHS  . ticagrelor  90 mg Oral BID   Continuous Infusions: . sodium chloride Stopped (12/31/14 0730)   acetaminophen, cyclobenzaprine, diazepam, morphine injection, nitroGLYCERIN, ondansetron (ZOFRAN) IV, oxyCODONE-acetaminophen **AND** oxyCODONE    PHYSICAL EXAM Filed Vitals:   12/31/14 2015 01/01/15 0112 01/01/15 0501 01/01/15 0829  BP: 144/95 129/90 138/77 123/83  Pulse: 71 79 72 75  Temp: 98.6 F (37 C) 98.2 F (36.8 C) 98.2 F (36.8 C) 97.9 F (36.6 C)  TempSrc: Oral Oral Oral Oral  Resp: 24   18  Height:      Weight:   107.956 kg (238 lb)   SpO2: 99% 99% 100% 95%   Well developed and nourished in no acute distress HENT normal Neck supple with JVP-flat Clear Regular rate and rhythm, no murmurs or  gallops Abd-soft with active BS  R femoral artery A no hematoma  No Clubbing cyanosis edema Skin-warm and dry A & Oriented  Grossly normal sensory and motor function  TELEMETRY: Reviewed telemetry pt in isnu:    Intake/Output Summary (Last 24 hours) at 01/01/15 0915 Last data filed at 01/01/15 0503  Gross per 24 hour  Intake    480 ml  Output    350 ml  Net    130 ml    LABS: Basic Metabolic Panel:  Recent Labs Lab 12/30/14 1338 12/30/14 1347 12/31/14 0212  NA 137 138 140  K 2.9* 4.0 3.4*  CL 105 105 110  CO2 24  --  23  GLUCOSE 127* 127* 99  BUN CREATININE 0.77 0.80 0.79  CALCIUM 8.3*  --  8.5   Cardiac Enzymes:  Recent Labs  12/30/14 1647 12/30/14 2253 12/31/14 0212  TROPONINI 5.29* 33.03* 26.33*   CBC:  Recent Labs Lab 12/30/14 1338 12/30/14 1347 12/31/14 0212  WBC 5.4  --  4.8  HGB 11.9* 14.6 11.7*  HCT 37.2 43.0 36.3  MCV 87.1  --  86.6  PLT 260  --  240   PROTIME:  Recent Labs  12/30/14 1338  LABPROT 13.3  INR  1.00   Liver Function Tests:    ASSESSMENT AND PLAN:  Principal Problem:   Acute ST elevation myocardial infarction (STEMI) involving other coronary artery of inferior wall Active Problems:   HTN (hypertension)   Tobacco abuse   Cardiomyopathy, ischemic  CAD:  Post PCI/stenting continue brillinta asa and beta blocker HTN:  Continue ARB EF had been 40-45%  On LV gram this admission 50%   D/C home today discussed smoking cessation   Signed, Charlton Haws MD  01/01/2015

## 2015-01-01 NOTE — Care Management Note (Signed)
    Page 1 of 1   01/01/2015     11:35:18 AM CARE MANAGEMENT NOTE 01/01/2015  Patient:  [name], [name]   Account Number:  [id number]  Date Initiated:  01/01/2015  Documentation initiated by:  [facility]  Subjective/Objective Assessment:   adm: Acute ST elevation myocardial infarction (STEMI) involving other coronary artery of inferior wall     Action/Plan:   discharge planning   Anticipated DC Date:  01/01/2015   Anticipated DC Plan:  HOME/SELF CARE      DC Planning Services  CM consult      Choice offered to / List presented to:             Status of service:  Completed, signed off Medicare Important Message given?   (If response is "NO", the following Medicare IM given date fields will be blank) Date Medicare IM given:   Medicare IM given by:   Date Additional Medicare IM given:   Additional Medicare IM given by:    Discharge Disposition:  HOME/SELF CARE  Per UR Regulation:    If discussed at Long Length of Stay Meetings, dates discussed:    Comments:  01/01/15 10:50 CM gave pt Free 30 day trial card for Brilinta.  Pt verbalized understanding this period of time will give her PCP or follow up MD office staff enough time to have the medication authorized for refills.  No other CM needs were communicated.  Freddy Jaksch, BSN, CM [phone].

## 2015-01-01 NOTE — Discharge Summary (Signed)
Patient ID: [name],  MRN: [id number], DOB/AGE: 57-Jan-1957 57 y.o.  Admit date: 12/30/2014 Discharge date: 01/01/2015  Primary Care Provider: Alice Reichert, MD Primary Cardiologist: Dr [name]  Discharge Diagnoses Principal Problem:   Acute ST elevation myocardial infarction (STEMI) involving other coronary artery of inferior wall Active Problems:   Cardiomyopathy, ischemic   HTN (hypertension)   Tobacco abuse    Procedures: Urgent cath/PCI 12/30/14    Hospital Course:  59 y.o. female who has a history of hypertension, hyperlipidemia, and known CAD. She underwent tandem stenting of her proximal to mid LAD in July 2013 by Dr. [name] with rescue PTCA of a jailed diagonal vessel. She has been relatively stable since until 12/30/14 when she developed recurrent chest pain of similar quality to her previous angina.She presented to [facility] emergency room were ECG showed inferior ST segment elevation.She was transferred to [facility] and taken to the cath lab by Dr Tresa Endo. Cath revealed subtotal stenosis with thrombus of the distal RCA beyond the PDA. She had patent tandem LAD stents, and normal CFX. She underwent successful PCI to the subtotal distal RCA stenosis treated with PTCA, insertion of a Xience Alpine 2.75 x 18 mm DES. Her post PCI course was unremarkable. Her Troponin peaked at 33.03. Dr Eden Emms feels she can be discharged 01/01/15. She'll follow up with Dr [name] once but then would like her care transferred to [facility].   Discharge Vitals:  Blood pressure 123/83, pulse 75, temperature 97.9 F (36.6 C), temperature source Oral, resp. rate 18, height  (1.676 m), weight 238 lb (107.956 kg), SpO2 95 %.    Labs: No results found for this or any previous visit (from the past 24 hour(s)).  Disposition:  Follow-up Information    Follow up with Marykay Lex, MD.   Specialty:  Cardiology   Why:  office will call you   Contact information:   [address] [place] [address] [phone]       Discharge Medications:    Medication List    STOP taking these medications        cefdinir 300 MG capsule  Commonly known as:  OMNICEF     cyclobenzaprine 10 MG tablet  Commonly known as:  FLEXERIL     GOODYS BODY PAIN PO     ibuprofen 800 MG tablet  Commonly known as:  ADVIL,MOTRIN     meloxicam 15 MG tablet  Commonly known as:  MOBIC      TAKE these medications        acetaminophen 325 MG tablet  Commonly known as:  TYLENOL  Take 2 tablets (650 mg total) by mouth every 4 (four) hours as needed for headache or mild pain.     aspirin EC 81 MG tablet  Take 81 mg by mouth daily.     b complex vitamins tablet  Take 1 tablet by mouth daily.     benazepril-hydrochlorthiazide 20-25 MG per tablet  Commonly known as:  LOTENSIN HCT  Take 1 tablet by mouth daily. <please make appointment with cardiologist for refills>     benzonatate 200 MG capsule  Commonly known as:  TESSALON  Take 200 mg by mouth every 4 (four) hours as needed for cough.     carvedilol 6.25 MG tablet  Commonly known as:  COREG  Take 1 tablet (6.25 mg total) by mouth 2 (two) times daily with a meal.     diazepam 5 MG tablet  Commonly known as:  VALIUM  Take 5 mg by mouth at bedtime as needed (for sleep).     FISH OIL PO  Take 1 capsule by mouth daily.     naproxen 500 MG tablet  Commonly known as:  NAPROSYN  Take 1 tablet (500 mg total) by mouth 2 (two) times daily.     nitroGLYCERIN 0.4 MG SL tablet  Commonly known as:  NITROSTAT  Place 1 tablet (0.4 mg total) under the tongue every 5 (five) minutes x 3 doses as needed for chest pain.     oxyCODONE-acetaminophen 7.5-325 MG per tablet  Commonly known as:  PERCOCET  Take 1 tablet by mouth 4 (four) times daily as needed for pain.     rosuvastatin 5 MG tablet  Commonly known as:  CRESTOR  Take 5 mg by mouth at bedtime.     ticagrelor 90 MG Tabs tablet  Commonly known as:  BRILINTA  Take 1  tablet (90 mg total) by mouth 2 (two) times daily.         Duration of Discharge Encounter: Greater than 30 minutes including physician time.  Jolene Provost PA-C 01/01/2015 9:31 AM

## 2015-01-02 LAB — POCT ACTIVATED CLOTTING TIME: ACTIVATED CLOTTING TIME: 564 s

## 2015-01-02 MED FILL — Fentanyl Citrate Inj 0.05 MG/ML: INTRAMUSCULAR | Qty: 2 | Status: AC

## 2015-01-02 MED FILL — Heparin Sodium (Porcine) 2 Unit/ML in Sodium Chloride 0.9%: INTRAMUSCULAR | Qty: 1500 | Status: AC

## 2015-01-02 MED FILL — Midazolam HCl Inj 2 MG/2ML (Base Equivalent): INTRAMUSCULAR | Qty: 2 | Status: AC

## 2015-01-02 MED FILL — Nitroglycerin IV Soln 100 MCG/ML in D5W: INTRA_ARTERIAL | Qty: 10 | Status: AC

## 2015-01-02 MED FILL — Sodium Chloride IV Soln 0.9%: INTRAVENOUS | Qty: 50 | Status: AC

## 2015-01-02 MED FILL — Ticagrelor Tab 90 MG: ORAL | Qty: 2 | Status: AC

## 2015-01-02 MED FILL — Bivalirudin For IV Soln 250 MG: INTRAVENOUS | Qty: 250 | Status: AC

## 2015-01-02 MED FILL — Atropine Sulfate Inj 0.1 MG/ML: INTRAMUSCULAR | Qty: 10 | Status: AC

## 2015-01-02 MED FILL — Lidocaine HCl Local Preservative Free (PF) Inj 1%: INTRAMUSCULAR | Qty: 30 | Status: AC

## 2015-01-25 ENCOUNTER — Encounter: Payer: Medicare HMO | Admitting: Cardiovascular Disease

## 2015-01-26 ENCOUNTER — Telehealth: Payer: Self-pay | Admitting: *Deleted

## 2015-01-26 MED ORDER — TICAGRELOR 90 MG PO TABS: 90.0000 mg | ORAL_TABLET | Freq: Two times a day (BID) | ORAL | Status: DC

## 2015-01-26 NOTE — Telephone Encounter (Signed)
Pt needs rx for brilinta called in to [facility] because her pharmacy doesn't have it.

## 2015-01-26 NOTE — Telephone Encounter (Signed)
rx sent to [facility] per pt request

## 2015-02-06 ENCOUNTER — Ambulatory Visit (INDEPENDENT_AMBULATORY_CARE_PROVIDER_SITE_OTHER): Payer: Medicare HMO | Admitting: Cardiovascular Disease

## 2015-02-06 ENCOUNTER — Encounter: Payer: Self-pay | Admitting: *Deleted

## 2015-02-06 ENCOUNTER — Encounter: Payer: Self-pay | Admitting: Cardiovascular Disease

## 2015-02-06 VITALS — BP 128/84 | HR 85 | Ht 64.0 in | Wt 235.0 lb

## 2015-02-06 DIAGNOSIS — I10 Essential (primary) hypertension: Secondary | ICD-10-CM

## 2015-02-06 DIAGNOSIS — Z87898 Personal history of other specified conditions: Secondary | ICD-10-CM

## 2015-02-06 DIAGNOSIS — I252 Old myocardial infarction: Secondary | ICD-10-CM

## 2015-02-06 DIAGNOSIS — E785 Hyperlipidemia, unspecified: Secondary | ICD-10-CM

## 2015-02-06 DIAGNOSIS — Z955 Presence of coronary angioplasty implant and graft: Secondary | ICD-10-CM

## 2015-02-06 DIAGNOSIS — I2510 Atherosclerotic heart disease of native coronary artery without angina pectoris: Secondary | ICD-10-CM

## 2015-02-06 DIAGNOSIS — Z9289 Personal history of other medical treatment: Secondary | ICD-10-CM

## 2015-02-06 DIAGNOSIS — Z716 Tobacco abuse counseling: Secondary | ICD-10-CM

## 2015-02-06 MED ORDER — VARENICLINE TARTRATE 0.5 MG X 11 & 1 MG X 42 PO MISC: ORAL | Status: DC

## 2015-02-06 NOTE — Patient Instructions (Signed)
   Chantix starter kit - sent to pharmacy today. Continue all other medications.   Follow up in  3 months

## 2015-02-06 NOTE — Progress Notes (Signed)
Patient ID: [name], female   DOB: [birth year], 59 y.o.   MRN: [id number]      SUBJECTIVE: The patient is a 59 year old woman with a history of coronary artery disease, hypertension, and hyperlipidemia. This is my first time meeting her. She previously underwent tandem stenting of her proximal to mid LAD in July 2013 by Dr. Ellyn Hack with rescue PTCA of a jailed diagonal branch. On 12/30/2014 she sustained an inferior wall ST elevation myocardial infarction. Coronary angiography revealed subtotal stenosis with thrombus of the distal RCA beyond the PDA. She underwent successful percutaneous coronary intervention with 1 drug-eluting stent. LV gram showed EF 50-55% with mild inferior wall hypokinesis.  She has been doing well and denies chest pain, palpitations, leg swelling, and shortness of breath. She had been under a lot of stress at the time of her MI as her 6 year old son was living with her and her husband and he was causing a lot of problems. He has since moved out and she is doing much better. 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 She would like to start exercising at the [facility] again. She smokes 8-10 cigarettes daily but would like to quit.  Review of Systems: As per "subjective", otherwise negative.  Allergies  Allergen Reactions  . Lipitor [Atorvastatin] Other (See Comments)    Severe bone aches    Current Outpatient Prescriptions  Medication Sig Dispense Refill  . acetaminophen (TYLENOL) 325 MG tablet Take 2 tablets (650 mg total) by mouth every 4 (four) hours as needed for headache or mild pain.    Marland Kitchen aspirin EC 81 MG tablet Take 81 mg by mouth daily.     Marland Kitchen b complex vitamins tablet Take 1 tablet by mouth daily.    . benazepril-hydrochlorthiazide (LOTENSIN HCT) 20-25 MG per tablet Take 1 tablet by mouth daily. <please make appointment with cardiologist for refills> 30 tablet 1  . benzonatate (TESSALON) 200 MG capsule Take 200 mg by mouth every 4 (four) hours as  needed for cough.    . carvedilol (COREG) 6.25 MG tablet Take 1 tablet (6.25 mg total) by mouth 2 (two) times daily with a meal. 60 tablet 11  . diazepam (VALIUM) 5 MG tablet Take 5 mg by mouth at bedtime as needed (for sleep).     . naproxen (NAPROSYN) 500 MG tablet Take 1 tablet (500 mg total) by mouth 2 (two) times daily. (Patient taking differently: Take 500 mg by mouth 2 (two) times daily as needed (pain). ) 15 tablet 0  . nitroGLYCERIN (NITROSTAT) 0.4 MG SL tablet Place 1 tablet (0.4 mg total) under the tongue every 5 (five) minutes x 3 doses as needed for chest pain. 25 tablet 2  . Omega-3 Fatty Acids (FISH OIL PO) Take 1 capsule by mouth daily.    Marland Kitchen oxyCODONE-acetaminophen (PERCOCET) 7.5-325 MG per tablet Take 1 tablet by mouth 4 (four) times daily as needed for pain.    . rosuvastatin (CRESTOR) 5 MG tablet Take 5 mg by mouth at bedtime.     . ticagrelor (BRILINTA) 90 MG TABS tablet Take 1 tablet (90 mg total) by mouth 2 (two) times daily. 60 tablet 11   No current facility-administered medications for this visit.    Past Medical History  Diagnosis Date  . Hypertension   . Coronary artery disease 06/2012    s/p NSTEMI with 100% LAD occlusion -- 2 Overlappin Xience DES Stents (2.5 mm x 28, 3.0 mm 18 mm)  . Ischemic cardiomyopathy 06/2012  EF ~40-45%  . Arthritis     Past Surgical History  Procedure Laterality Date  . Knee surgery    . Cardiac catheterization    . Coronary stent placement  07/15/12  . Left heart catheterization with coronary angiogram N/A 07/13/2012    Procedure: LEFT HEART CATHETERIZATION WITH CORONARY ANGIOGRAM;  Surgeon: Leonie Man, MD;  Location: [facility] CATH LAB;  Service: Cardiovascular;  Laterality: N/A;  . Percutaneous coronary stent intervention (pci-s)  07/13/2012    Procedure: PERCUTANEOUS CORONARY STENT INTERVENTION (PCI-S);  Surgeon: Leonie Man, MD;  Location: [facility] CATH LAB;  Service: Cardiovascular;;  . Left heart catheterization with coronary  angiogram N/A 11/20/2012    Procedure: LEFT HEART CATHETERIZATION WITH CORONARY ANGIOGRAM;  Surgeon: Leonie Man, MD;  Location: [facility] CATH LAB;  Service: Cardiovascular;  Laterality: N/A;  . Cholecystectomy    . Left heart catheterization with coronary angiogram N/A 12/30/2014    Procedure: LEFT HEART CATHETERIZATION WITH CORONARY ANGIOGRAM;  Surgeon: Troy Sine, MD;  Location: [facility] CATH LAB;  Service: Cardiovascular;  Laterality: N/A;  . Percutaneous coronary stent intervention (pci-s)  12/30/2014    Procedure: PERCUTANEOUS CORONARY STENT INTERVENTION (PCI-S);  Surgeon: Troy Sine, MD;  Location: [facility] CATH LAB;  Service: Cardiovascular;;    History   Social History  . Marital Status: Married    Spouse Name: N/A    Number of Children: N/A  . Years of Education: N/A   Occupational History  . Not on file.   Social History Main Topics  . Smoking status: Current Every Day Smoker -- 0.50 packs/day for 20 years    Types: Cigarettes  . Smokeless tobacco: Never Used     Comment: 8 ciggs per day  . Alcohol Use: No  . Drug Use: No  . Sexual Activity: Yes   Other Topics Concern  . Not on file   Social History Narrative     Filed Vitals:   02/06/15 1606  Height: '5\' 4"'  (1.626 m)  Weight: 235 lb (106.595 kg)   BP 128/84  Pulse 85 SpO2 95%   PHYSICAL EXAM General: NAD HEENT: Normal. Neck: No JVD, no thyromegaly. Lungs: Clear to auscultation bilaterally with normal respiratory effort. CV: Nondisplaced PMI.  Regular rate and rhythm, normal S1/S2, no S3/S4, no murmur. No pretibial or periankle edema.  No carotid bruit.   Abdomen: Soft, nontender, obese, no distention.  Neurologic: Alert and oriented x 3.  Psych: Normal affect. Skin: Normal. Musculoskeletal: Normal range of motion, no gross deformities. Extremities: No clubbing or cyanosis.   ECG: Most recent ECG reviewed.      ASSESSMENT AND PLAN: 1. CAD s/p RCA DES with inferior STEMI: Symptomatically stable.  Continue ASA, Coreg, Brilinta, and Crestor. She would prefer to go to the [facility] rather than participate in cardiac rehabilitation, which she has done before. 2. Essential HTN: Well controlled on current dose of Coreg and benazepril-HCTZ. No changes. 3. Hyperlipidemia: Will obtain copy of lipids reportedly checked by PCP last week. Continue current dose of statin therapy. 4. Tobacco abuse: Cessation counseling provided. She is willing to quit. Will prescribe Chantix starter kit.  Dispo: f/u 6 months.  Kate Sable, M.D., F.A.C.C.

## 2015-05-16 ENCOUNTER — Ambulatory Visit: Payer: Medicare HMO | Admitting: Cardiovascular Disease

## 2015-05-18 ENCOUNTER — Ambulatory Visit (INDEPENDENT_AMBULATORY_CARE_PROVIDER_SITE_OTHER): Payer: Medicare HMO | Admitting: Cardiovascular Disease

## 2015-05-18 ENCOUNTER — Encounter: Payer: Self-pay | Admitting: Cardiovascular Disease

## 2015-05-18 VITALS — BP 132/88 | HR 85 | Ht 64.0 in | Wt 232.0 lb

## 2015-05-18 DIAGNOSIS — E785 Hyperlipidemia, unspecified: Secondary | ICD-10-CM

## 2015-05-18 DIAGNOSIS — I252 Old myocardial infarction: Secondary | ICD-10-CM

## 2015-05-18 DIAGNOSIS — F17201 Nicotine dependence, unspecified, in remission: Secondary | ICD-10-CM

## 2015-05-18 DIAGNOSIS — Z955 Presence of coronary angioplasty implant and graft: Secondary | ICD-10-CM

## 2015-05-18 DIAGNOSIS — I2510 Atherosclerotic heart disease of native coronary artery without angina pectoris: Secondary | ICD-10-CM | POA: Diagnosis not present

## 2015-05-18 DIAGNOSIS — I10 Essential (primary) hypertension: Secondary | ICD-10-CM | POA: Diagnosis not present

## 2015-05-18 MED ORDER — VARENICLINE TARTRATE 0.5 MG X 11 & 1 MG X 42 PO MISC: ORAL | Status: DC

## 2015-05-18 NOTE — Patient Instructions (Addendum)
[facility]. Continue all other medications.   Your physician wants you to follow up in: 6 months.  You will receive a reminder letter in the mail one-two months in advance.  If you don't receive a letter, please call our office to schedule the follow up appointment

## 2015-05-18 NOTE — Progress Notes (Signed)
Patient ID: Deland PrettyMary D Delsignore, female   DOB: 09-28-1956, 59 y.o.   MRN: [id number]      SUBJECTIVE: The patient presents for follow-up of coronary artery disease. She previously underwent tandem stenting of her proximal to mid LAD in July 2013 by Dr. Herbie BaltimoreHarding with rescue PTCA of a jailed diagonal branch. On 12/30/2014 she sustained an inferior wall ST elevation myocardial infarction. Coronary angiography revealed subtotal stenosis with thrombus of the distal RCA beyond the PDA. She underwent successful percutaneous coronary intervention with 1 drug-eluting stent. LV gram showed EF 50-55% with mild inferior wall hypokinesis.  She has significantly modified her diet and does not eat red meat and eats a lot of baked chicken and fish. She has cut back her smoking to 2 cigarettes daily. She stays active gardening and exercising at the [employer] [employer]. She denies palpitations. Since her last visit with me in February, her brother passed away and this significant affected her. Since then, she has tried to make significant changes in her lifestyle.  Review of Systems: As per "subjective", otherwise negative.  Allergies  Allergen Reactions  . Lipitor [Atorvastatin] Other (See Comments)    Severe bone aches    Current Outpatient Prescriptions  Medication Sig Dispense Refill  . aspirin EC 81 MG tablet Take 81 mg by mouth daily.     Marland Kitchen. b complex vitamins tablet Take 1 tablet by mouth daily.    . benazepril-hydrochlorthiazide (LOTENSIN HCT) 20-25 MG per tablet Take 1 tablet by mouth daily. <please make appointment with cardiologist for refills> 30 tablet 1  . benzonatate (TESSALON) 200 MG capsule Take 200 mg by mouth every 4 (four) hours as needed for cough.    . carvedilol (COREG) 6.25 MG tablet Take 1 tablet (6.25 mg total) by mouth 2 (two) times daily with a meal. 60 tablet 11  . cyclobenzaprine (FLEXERIL) 10 MG tablet Take 10 mg by mouth 3 (three) times daily as needed for muscle spasms.    . diazepam (VALIUM) 5 MG  tablet Take 5 mg by mouth at bedtime as needed (for sleep).     . naproxen (NAPROSYN) 500 MG tablet Take 1 tablet (500 mg total) by mouth 2 (two) times daily. (Patient taking differently: Take 500 mg by mouth 2 (two) times daily as needed (pain). ) 15 tablet 0  . nitroGLYCERIN (NITROSTAT) 0.4 MG SL tablet Place 1 tablet (0.4 mg total) under the tongue every 5 (five) minutes x 3 doses as needed for chest pain. 25 tablet 2  . Omega-3 Fatty Acids (FISH OIL PO) Take 1 capsule by mouth daily.    . rosuvastatin (CRESTOR) 5 MG tablet Take 5 mg by mouth at bedtime.     . ticagrelor (BRILINTA) 90 MG TABS tablet Take 1 tablet (90 mg total) by mouth 2 (two) times daily. 60 tablet 11  . varenicline (CHANTIX STARTING MONTH PAK) 0.5 MG X 11 & 1 MG X 42 tablet Take one 0.5 mg tablet by mouth once daily for 3 days, then increase to one 0.5 mg tablet twice daily for 4 days, then increase to one 1 mg tablet twice daily. 53 tablet 0   No current facility-administered medications for this visit.    Past Medical History  Diagnosis Date  . Hypertension   . Coronary artery disease 06/2012    s/p NSTEMI with 100% LAD occlusion -- 2 Overlappin Xience DES Stents (2.5 mm x 28, 3.0 mm 18 mm)  . Ischemic cardiomyopathy 06/2012    EF ~40-45%  .  Arthritis     Past Surgical History  Procedure Laterality Date  . Knee surgery    . Cardiac catheterization    . Coronary stent placement  07/15/12  . Left heart catheterization with coronary angiogram N/A 07/13/2012    Procedure: LEFT HEART CATHETERIZATION WITH CORONARY ANGIOGRAM;  Surgeon: Marykay Lexavid W Harding, MD;  [facility];  Service: Cardiovascular;  Laterality: N/A;  . Percutaneous coronary stent intervention (pci-s)  07/13/2012    Procedure: PERCUTANEOUS CORONARY STENT INTERVENTION (PCI-S);  Surgeon: Marykay Lexavid W Harding, MD;  [facility];  Service: Cardiovascular;;  . Left heart catheterization with coronary angiogram N/A 11/20/2012    Procedure: LEFT HEART  CATHETERIZATION WITH CORONARY ANGIOGRAM;  Surgeon: Marykay Lexavid W Harding, MD;  [facility];  Service: Cardiovascular;  Laterality: N/A;  . Cholecystectomy    . Left heart catheterization with coronary angiogram N/A 12/30/2014    Procedure: LEFT HEART CATHETERIZATION WITH CORONARY ANGIOGRAM;  Surgeon: Lennette Biharihomas A Kelly, MD;  [facility];  Service: Cardiovascular;  Laterality: N/A;  . Percutaneous coronary stent intervention (pci-s)  12/30/2014    Procedure: PERCUTANEOUS CORONARY STENT INTERVENTION (PCI-S);  Surgeon: Lennette Biharihomas A Kelly, MD;  [facility];  Service: Cardiovascular;;    History   Social History  . Marital Status: Married    Spouse Name: N/A  . Number of Children: N/A  . Years of Education: N/A   Occupational History  . Not on file.   Social History Main Topics  . Smoking status: Current Every Day Smoker -- 0.50 packs/day for 20 years    Types: Cigarettes  . Smokeless tobacco: Never Used     Comment: 4 ciggs per day  . Alcohol Use: No  . Drug Use: No  . Sexual Activity: Yes   Other Topics Concern  . Not on file   Social History Narrative     Filed Vitals:   05/18/15 1447  BP: 132/88  Pulse: 85  Height: 5\' 4"  (1.626 m)  Weight: 232 lb (105.235 kg)  SpO2: 97%    PHYSICAL EXAM General: NAD HEENT: Normal. Neck: No JVD, no thyromegaly. Lungs: Faint intermittent inspiratory wheezes, no rales. CV: Nondisplaced PMI.  Regular rate and rhythm, normal S1/S2, no S3/S4, no murmur. No pretibial or periankle edema.  No carotid bruit.  Normal pedal pulses.  Abdomen: Soft, nontender, no hepatosplenomegaly, no distention.  Neurologic: Alert and oriented x 3.  Psych: Normal affect. Skin: Normal. Musculoskeletal: Normal range of motion, no gross deformities. Extremities: No clubbing or cyanosis.   ECG: Most recent ECG reviewed.      ASSESSMENT AND PLAN: 1. CAD s/p RCA DES with inferior STEMI: Symptomatically stable. Continue ASA, Coreg, Brilinta,  and Crestor. Encouraged on lifestyle changes.  2. Essential HTN: Well controlled on current dose of Coreg and benazepril-HCTZ. No changes.  3. Hyperlipidemia: Lipids on 02/01/15 demonstrated total cholesterol 135, HDL 59, LDL 64, triglycerides 58. Continue current dose of statin therapy.  4. Tobacco abuse: Encouraged on smoking reduction. Will refill Chantix.  Dispo: f/u 6 months.   Prentice DockerSuresh Abria Vannostrand, M.D., F.A.C.C.

## 2016-02-28 ENCOUNTER — Other Ambulatory Visit: Payer: Self-pay | Admitting: Cardiovascular Disease

## 2016-02-29 ENCOUNTER — Other Ambulatory Visit: Payer: Self-pay | Admitting: Cardiology

## 2016-02-29 NOTE — Telephone Encounter (Signed)
REFILL 

## 2016-04-15 ENCOUNTER — Other Ambulatory Visit: Payer: Self-pay | Admitting: Cardiovascular Disease

## 2016-04-15 ENCOUNTER — Other Ambulatory Visit: Payer: Self-pay | Admitting: Cardiology

## 2016-04-17 ENCOUNTER — Encounter: Payer: Self-pay | Admitting: Cardiovascular Disease

## 2016-04-17 ENCOUNTER — Ambulatory Visit (INDEPENDENT_AMBULATORY_CARE_PROVIDER_SITE_OTHER): Payer: Medicare HMO | Admitting: Cardiovascular Disease

## 2016-04-17 VITALS — BP 125/85 | HR 76 | Ht 64.0 in | Wt 217.0 lb

## 2016-04-17 DIAGNOSIS — Z955 Presence of coronary angioplasty implant and graft: Secondary | ICD-10-CM | POA: Diagnosis not present

## 2016-04-17 DIAGNOSIS — I2510 Atherosclerotic heart disease of native coronary artery without angina pectoris: Secondary | ICD-10-CM | POA: Diagnosis not present

## 2016-04-17 DIAGNOSIS — I252 Old myocardial infarction: Secondary | ICD-10-CM

## 2016-04-17 DIAGNOSIS — Z720 Tobacco use: Secondary | ICD-10-CM

## 2016-04-17 DIAGNOSIS — E785 Hyperlipidemia, unspecified: Secondary | ICD-10-CM

## 2016-04-17 DIAGNOSIS — I10 Essential (primary) hypertension: Secondary | ICD-10-CM | POA: Diagnosis not present

## 2016-04-17 MED ORDER — TICAGRELOR 60 MG PO TABS: 60.0000 mg | ORAL_TABLET | Freq: Two times a day (BID) | ORAL | Status: DC

## 2016-04-17 NOTE — Progress Notes (Signed)
Patient ID: [name], female   DOB: 30-Aug-1956, 60 y.o.   MRN: [id number]      SUBJECTIVE: The patient presents for follow-up of coronary artery disease. She underwent tandem stenting of her proximal to mid LAD in July 2013 by Dr. [name] with rescue PTCA of a jailed diagonal branch. On 12/30/2014 she sustained an inferior wall ST elevation myocardial infarction. Coronary angiography revealed subtotal stenosis with thrombus of the distal RCA beyond the PDA. She underwent successful percutaneous coronary intervention with 1 drug-eluting stent. LV gram showed EF 50-55% with mild inferior wall hypokinesis.  ECG performed in the office today shows sinus rhythm with nonspecific T wave abnormalities.  She denies chest pain and shortness of breath. She continues to eat healthy foods. She smokes 6 or 7 cigarettes daily. She volunteers at the soup kitchen.   Review of Systems: As per "subjective", otherwise negative.  Allergies  Allergen Reactions  . Lipitor [Atorvastatin] Other (See Comments)    Severe bone aches    Current Outpatient Prescriptions  Medication Sig Dispense Refill  . aspirin EC 81 MG tablet Take 81 mg by mouth daily.     Marland Kitchen b complex vitamins tablet Take 1 tablet by mouth daily.    . benazepril-hydrochlorthiazide (LOTENSIN HCT) 20-25 MG per tablet Take 1 tablet by mouth daily. <please make appointment with cardiologist for refills> 30 tablet 1  . benzonatate (TESSALON) 200 MG capsule Take 200 mg by mouth every 4 (four) hours as needed for cough.    . BRILINTA 90 MG TABS tablet TAKE ONE TABLET TWICE DAILY 15 tablet 0  . carvedilol (COREG) 6.25 MG tablet TAKE ONE TABLET TWICE DAILY WITH A MEAL 60 tablet 0  . cyclobenzaprine (FLEXERIL) 10 MG tablet Take 10 mg by mouth 3 (three) times daily as needed for muscle spasms.    . diazepam (VALIUM) 5 MG tablet Take 5 mg by mouth at bedtime as needed (for sleep).     . naproxen (NAPROSYN) 500 MG tablet Take 1 tablet (500 mg total) by  mouth 2 (two) times daily. (Patient taking differently: Take 500 mg by mouth 2 (two) times daily as needed (pain). ) 15 tablet 0  . nitroGLYCERIN (NITROSTAT) 0.4 MG SL tablet PLACE 1 TABLET UNDER TONGUE EVERY 5 MINUTES FOR 3 DOSES AS NEEDED. 25 tablet 1  . Omega-3 Fatty Acids (FISH OIL PO) Take 1 capsule by mouth daily.    . rosuvastatin (CRESTOR) 5 MG tablet Take 5 mg by mouth at bedtime.      No current facility-administered medications for this visit.    Past Medical History  Diagnosis Date  . Hypertension   . Coronary artery disease 06/2012    s/p NSTEMI with 100% LAD occlusion -- 2 Overlappin Xience DES Stents (2.5 mm x 28, 3.0 mm 18 mm)  . Ischemic cardiomyopathy 06/2012    EF ~40-45%  . Arthritis     Past Surgical History  Procedure Laterality Date  . Knee surgery    . Cardiac catheterization    . Coronary stent placement  07/15/12  . Left heart catheterization with coronary angiogram N/A 07/13/2012    Procedure: LEFT HEART CATHETERIZATION WITH CORONARY ANGIOGRAM;  Surgeon: Marykay Lex, MD;  Location: [facility] CATH LAB;  Service: Cardiovascular;  Laterality: N/A;  . Percutaneous coronary stent intervention (pci-s)  07/13/2012    Procedure: PERCUTANEOUS CORONARY STENT INTERVENTION (PCI-S);  Surgeon: Marykay Lex, MD;  Location: [facility] CATH LAB;  Service: Cardiovascular;;  . Left heart catheterization  with coronary angiogram N/A 11/20/2012    Procedure: LEFT HEART CATHETERIZATION WITH CORONARY ANGIOGRAM;  Surgeon: Marykay Lexavid W Harding, MD;  [facility];  Service: Cardiovascular;  Laterality: N/A;  . Cholecystectomy    . Left heart catheterization with coronary angiogram N/A 12/30/2014    Procedure: LEFT HEART CATHETERIZATION WITH CORONARY ANGIOGRAM;  Surgeon: Lennette Biharihomas A Kelly, MD;  [facility];  Service: Cardiovascular;  Laterality: N/A;  . Percutaneous coronary stent intervention (pci-s)  12/30/2014    Procedure: PERCUTANEOUS CORONARY STENT INTERVENTION (PCI-S);  Surgeon:  Lennette Biharihomas A Kelly, MD;  [facility];  Service: Cardiovascular;;    Social History   Social History  . Marital Status: Married    Spouse Name: N/A  . Number of Children: N/A  . Years of Education: N/A   Occupational History  . Not on file.   Social History Main Topics  . Smoking status: Current Every Day Smoker -- 0.50 packs/day for 20 years    Types: Cigarettes    Start date: 11/23/1976  . Smokeless tobacco: Never Used     Comment: 4 ciggs per day  . Alcohol Use: No  . Drug Use: No  . Sexual Activity: Yes   Other Topics Concern  . Not on file   Social History Narrative     Filed Vitals:   04/17/16 0834  BP: 125/85  Pulse: 76  Height: 5\' 4"  (1.626 m)  Weight: 217 lb (98.431 kg)    PHYSICAL EXAM General: NAD HEENT: Normal. Neck: No JVD, no thyromegaly. Lungs: Clear to auscultation bilaterally with normal respiratory effort. CV: Nondisplaced PMI.  Regular rate and rhythm, normal S1/S2, no S3/S4, no murmur. No pretibial or periankle edema.  No carotid bruit.   Abdomen: Soft, nontender, no distention.  Neurologic: Alert and oriented.  Psych: Normal affect. Skin: Normal. Musculoskeletal: No gross deformities.  ECG: Most recent ECG reviewed.      ASSESSMENT AND PLAN: 1. CAD s/p RCA DES with inferior STEMI: Symptomatically stable. Continue ASA, Coreg, Brilinta (reduce to 60 mg BID), and Crestor.  2. Essential HTN: Well controlled on current dose of Coreg and benazepril-HCTZ. No changes.  3. Hyperlipidemia: Will obtain copy of lipids. Continue current dose of statin therapy.  4. Tobacco abuse: Smokes 6-7 cigarettes daily, more if she is anxious.  Dispo: fu 1 year.   Prentice DockerSuresh Ariela Mochizuki, M.D., F.A.C.C.

## 2016-04-17 NOTE — Patient Instructions (Signed)
Your physician wants you to follow-up in: 1 YEAR WITH DR. Purvis SheffieldKONESWARAN You will receive a reminder letter in the mail two months in advance. If you don't receive a letter, please call our office to schedule the follow-up appointment.  Your physician has recommended you make the following change in your medication:   DECREASE BRILINTA 60 MG TWICE DAILY  Thank you for choosing [facility]!!

## 2016-06-14 ENCOUNTER — Other Ambulatory Visit: Payer: Self-pay

## 2016-06-14 MED ORDER — CARVEDILOL 6.25 MG PO TABS: ORAL_TABLET | ORAL | Status: DC

## 2016-07-16 ENCOUNTER — Other Ambulatory Visit: Payer: Self-pay | Admitting: *Deleted

## 2016-07-16 MED ORDER — CARVEDILOL 6.25 MG PO TABS: ORAL_TABLET | ORAL | Status: DC

## 2017-04-17 ENCOUNTER — Other Ambulatory Visit: Payer: Self-pay | Admitting: Cardiovascular Disease

## 2017-05-20 ENCOUNTER — Other Ambulatory Visit: Payer: Self-pay | Admitting: Cardiovascular Disease

## 2017-07-14 ENCOUNTER — Other Ambulatory Visit: Payer: Self-pay | Admitting: Cardiology

## 2017-07-18 ENCOUNTER — Emergency Department (HOSPITAL_COMMUNITY): Payer: Medicare HMO

## 2017-07-18 ENCOUNTER — Encounter (HOSPITAL_COMMUNITY): Payer: Self-pay | Admitting: Emergency Medicine

## 2017-07-18 ENCOUNTER — Emergency Department (HOSPITAL_COMMUNITY)
Admission: EM | Admit: 2017-07-18 | Discharge: 2017-07-18 | Disposition: A | Discharge status: Home / Self Care | Payer: Medicare HMO | Attending: Emergency Medicine | Admitting: Emergency Medicine

## 2017-07-18 DIAGNOSIS — E876 Hypokalemia: Secondary | ICD-10-CM

## 2017-07-18 DIAGNOSIS — R079 Chest pain, unspecified: Secondary | ICD-10-CM | POA: Diagnosis present

## 2017-07-18 DIAGNOSIS — Z79899 Other long term (current) drug therapy: Secondary | ICD-10-CM | POA: Insufficient documentation

## 2017-07-18 DIAGNOSIS — Z7982 Long term (current) use of aspirin: Secondary | ICD-10-CM | POA: Diagnosis not present

## 2017-07-18 DIAGNOSIS — I2510 Atherosclerotic heart disease of native coronary artery without angina pectoris: Secondary | ICD-10-CM | POA: Diagnosis not present

## 2017-07-18 DIAGNOSIS — Z955 Presence of coronary angioplasty implant and graft: Secondary | ICD-10-CM | POA: Diagnosis not present

## 2017-07-18 DIAGNOSIS — F17210 Nicotine dependence, cigarettes, uncomplicated: Secondary | ICD-10-CM | POA: Insufficient documentation

## 2017-07-18 DIAGNOSIS — I119 Hypertensive heart disease without heart failure: Secondary | ICD-10-CM | POA: Diagnosis not present

## 2017-07-18 DIAGNOSIS — I255 Ischemic cardiomyopathy: Secondary | ICD-10-CM | POA: Insufficient documentation

## 2017-07-18 LAB — COMPREHENSIVE METABOLIC PANEL
ALK PHOS: 47 U/L (ref 38–126)
ALT: 14 U/L (ref 14–54)
ANION GAP: 9 (ref 5–15)
AST: 15 U/L (ref 15–41)
Albumin: 3.6 g/dL (ref 3.5–5.0)
BUN: 11 mg/dL (ref 6–20)
CALCIUM: 9 mg/dL (ref 8.9–10.3)
CO2: 25 mmol/L (ref 22–32)
CREATININE: 0.9 mg/dL (ref 0.44–1.00)
Chloride: 102 mmol/L (ref 101–111)
GFR calc non Af Amer: >60 mL/min (ref >60)
GLUCOSE: 110 mg/dL — AB (ref 65–99)
Potassium: 2.8 mmol/L — ABNORMAL LOW (ref 3.5–5.1)
Sodium: 136 mmol/L (ref 135–145)
Total Bilirubin: 0.5 mg/dL (ref 0.3–1.2)
Total Protein: 6.7 g/dL (ref 6.5–8.1)

## 2017-07-18 LAB — CBC WITH DIFFERENTIAL/PLATELET
BASOS ABS: 0 10*3/uL (ref 0.0–0.1)
BASOS PCT: 0 %
EOS ABS: 0.1 10*3/uL (ref 0.0–0.7)
Eosinophils Relative: 3 %
HEMATOCRIT: 37.9 % (ref 36.0–46.0)
Hemoglobin: 12.4 g/dL (ref 12.0–15.0)
Lymphocytes Relative: 55 %
Lymphs Abs: 2.6 10*3/uL (ref 0.7–4.0)
MCH: 28.8 pg (ref 26.0–34.0)
MCHC: 32.7 g/dL (ref 30.0–36.0)
MCV: 88.1 fL (ref 78.0–100.0)
MONO ABS: 0.3 10*3/uL (ref 0.1–1.0)
MONOS PCT: 7 %
NEUTROS ABS: 1.7 10*3/uL (ref 1.7–7.7)
NEUTROS PCT: 35 %
PLATELETS: 258 10*3/uL (ref 150–400)
RBC: 4.3 MIL/uL (ref 3.87–5.11)
RDW: 14.3 % (ref 11.5–15.5)
WBC: 4.7 10*3/uL (ref 4.0–10.5)

## 2017-07-18 LAB — TROPONIN I

## 2017-07-18 MED ORDER — POTASSIUM CHLORIDE CRYS ER 20 MEQ PO TBCR: 20.0000 meq | EXTENDED_RELEASE_TABLET | Freq: Two times a day (BID) | ORAL | 0 refills | Status: DC

## 2017-07-18 MED ORDER — NITROGLYCERIN 0.4 MG SL SUBL
0.4000 mg | SUBLINGUAL_TABLET | Freq: Once | SUBLINGUAL | Status: DC
  Filled 2017-07-18: qty 1

## 2017-07-18 MED ORDER — NITROGLYCERIN 0.4 MG SL SUBL
SUBLINGUAL_TABLET | SUBLINGUAL | Status: DC
Start: 2017-07-18 — End: 2017-07-18
  Filled 2017-07-18: qty 1

## 2017-07-18 NOTE — Discharge Instructions (Signed)
You are leaving before we completed the full chest pain workup. This could be your heart causing the pain. Follow-up to her cardiologist or primary care doctor soon as possible. Feel free to return in time.
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
## 2017-07-24 ENCOUNTER — Other Ambulatory Visit: Payer: Self-pay | Admitting: Cardiology

## 2017-08-05 ENCOUNTER — Encounter: Payer: Self-pay | Admitting: Cardiovascular Disease

## 2017-08-05 ENCOUNTER — Ambulatory Visit (INDEPENDENT_AMBULATORY_CARE_PROVIDER_SITE_OTHER): Payer: Medicare HMO | Admitting: Cardiovascular Disease

## 2017-08-05 VITALS — BP 118/84 | HR 72 | Ht 67.0 in | Wt 222.0 lb

## 2017-08-05 DIAGNOSIS — I10 Essential (primary) hypertension: Secondary | ICD-10-CM

## 2017-08-05 DIAGNOSIS — I252 Old myocardial infarction: Secondary | ICD-10-CM

## 2017-08-05 DIAGNOSIS — E876 Hypokalemia: Secondary | ICD-10-CM | POA: Diagnosis not present

## 2017-08-05 DIAGNOSIS — E785 Hyperlipidemia, unspecified: Secondary | ICD-10-CM

## 2017-08-05 DIAGNOSIS — Z955 Presence of coronary angioplasty implant and graft: Secondary | ICD-10-CM | POA: Diagnosis not present

## 2017-08-05 DIAGNOSIS — Z716 Tobacco abuse counseling: Secondary | ICD-10-CM

## 2017-08-05 DIAGNOSIS — Z9289 Personal history of other medical treatment: Secondary | ICD-10-CM | POA: Diagnosis not present

## 2017-08-05 DIAGNOSIS — I25118 Atherosclerotic heart disease of native coronary artery with other forms of angina pectoris: Secondary | ICD-10-CM

## 2017-08-05 MED ORDER — VARENICLINE TARTRATE 0.5 MG X 11 & 1 MG X 42 PO MISC: ORAL | 0 refills | Status: DC

## 2017-08-05 MED ORDER — VARENICLINE TARTRATE 0.5 MG X 11 & 1 MG X 42 PO MISC
ORAL | 0 refills | Status: DC
Start: 2017-08-05 — End: 2017-08-05

## 2017-08-05 NOTE — Patient Instructions (Signed)
Medication Instructions:   Begin Chantix starter kit.  Continue all other medications.    Labwork: none  Testing/Procedures: none  Follow-Up: Your physician wants you to follow up in:  1 year.  You will receive a reminder letter in the mail one-two months in advance.  If you don't receive a letter, please call our office to schedule the follow up appointment   Any Other Special Instructions Will Be Listed Below (If Applicable).  If you need a refill on your cardiac medications before your next appointment, please call your pharmacy.
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
## 2017-08-12 ENCOUNTER — Encounter: Payer: Self-pay | Admitting: *Deleted

## 2017-09-02 ENCOUNTER — Other Ambulatory Visit: Payer: Self-pay | Admitting: Cardiovascular Disease

## 2018-08-11 ENCOUNTER — Other Ambulatory Visit: Payer: Self-pay | Admitting: Cardiovascular Disease

## 2018-08-13 IMAGING — DX DG CHEST 2V
2 series · 2 of 2 positions shown · non-contrast
Comparison: 12/30/2014.  10/21/2014.

CLINICAL DATA: Central chest pressure.

EXAM:
CHEST  2 VIEW

[chest pa]
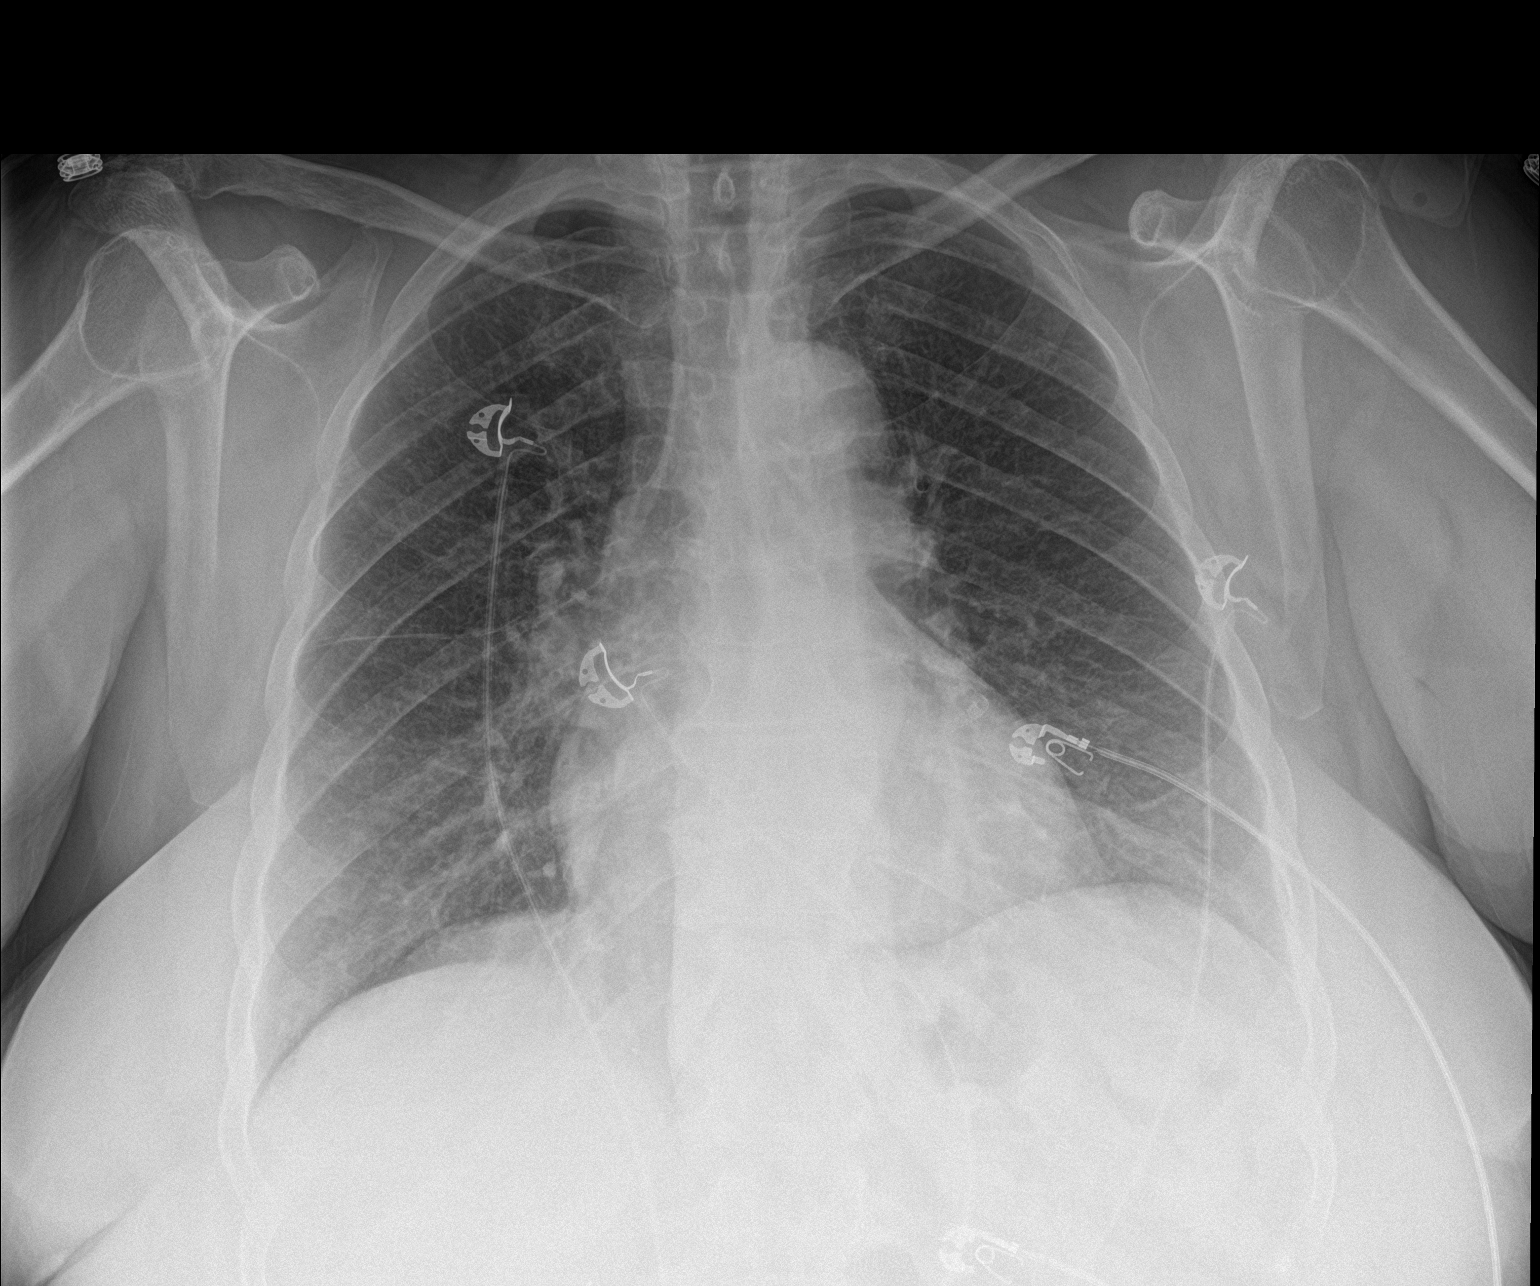

[chest lat]
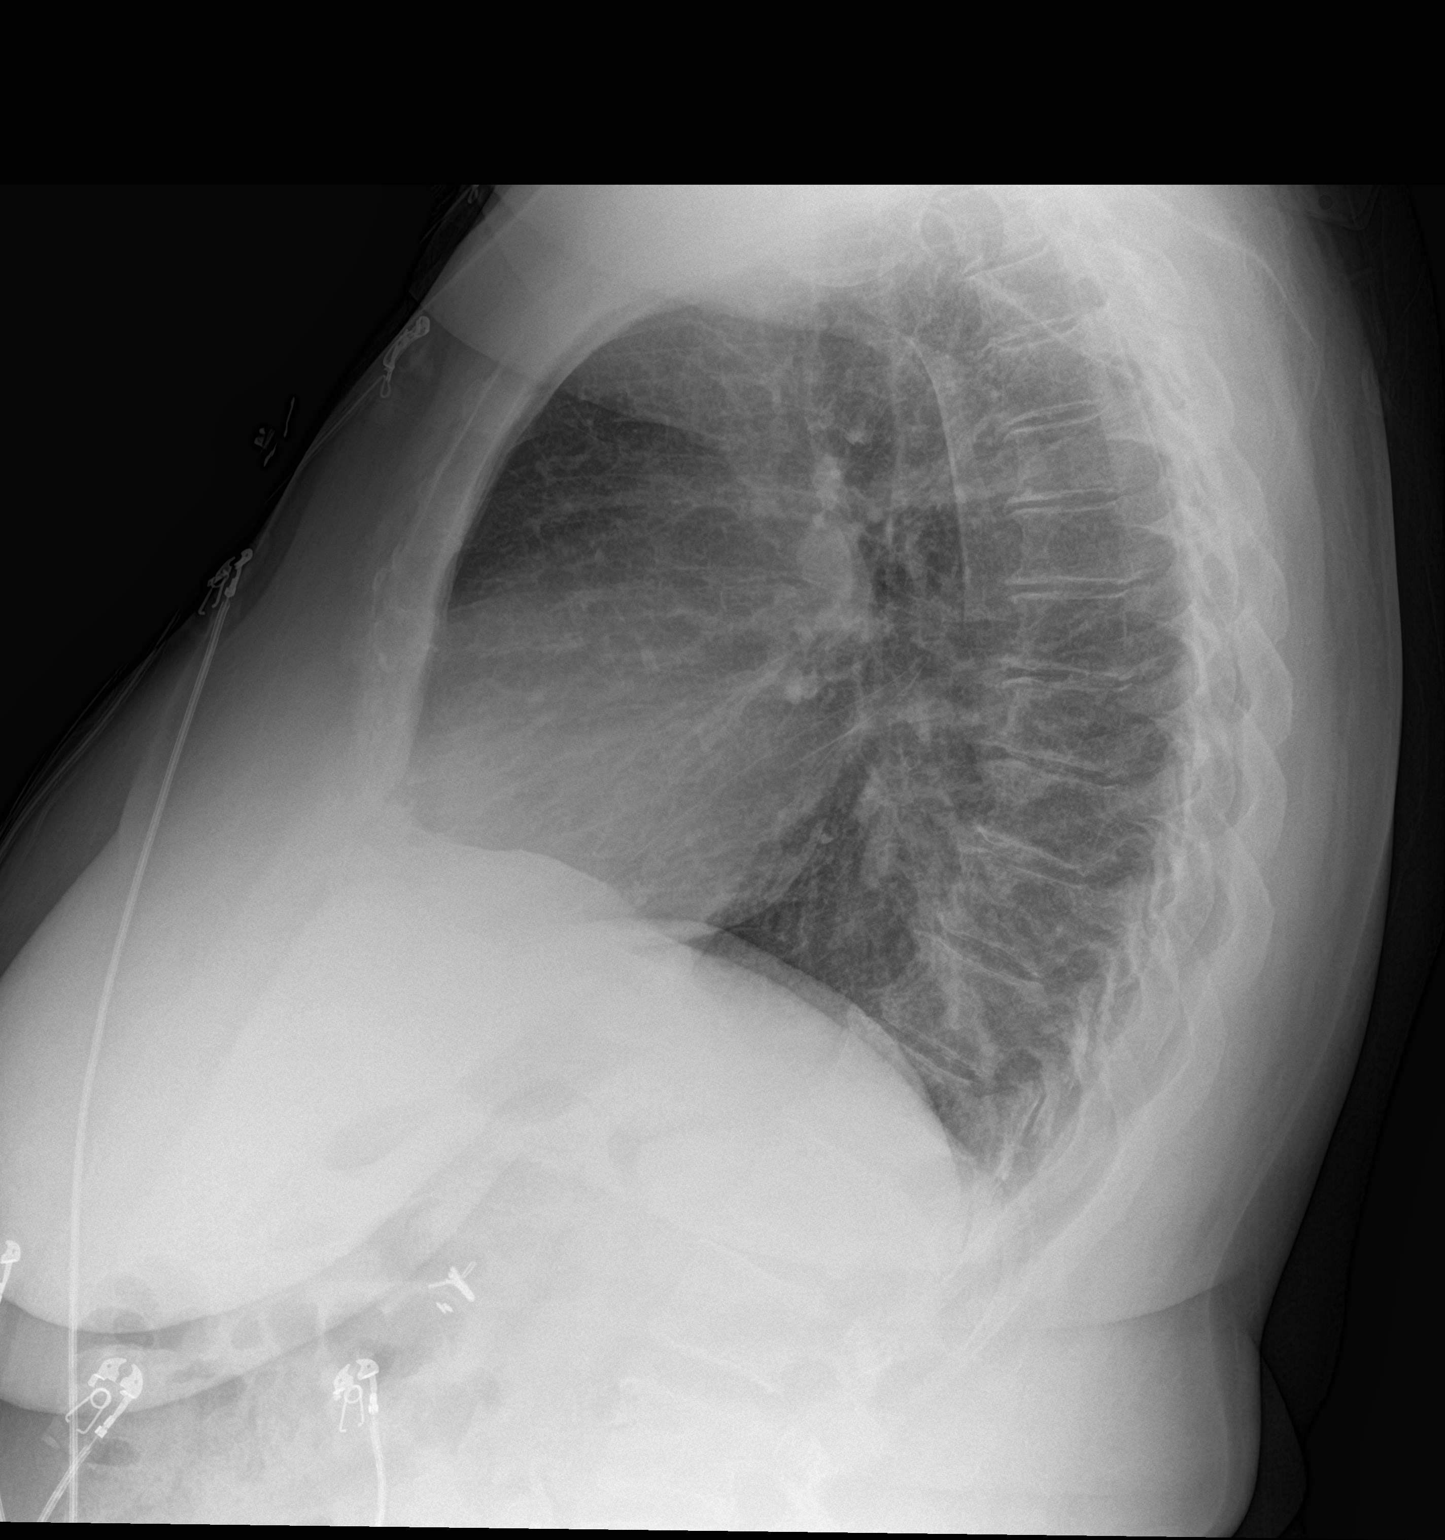

[2 of 2 positions shown; findings below may reference images not displayed]

FINDINGS: Heart size normal. No focal infiltrate. Stable mild elevation left
hemidiaphragm. No pleural effusion or pneumothorax. Surgical clips
upper abdomen. Degenerative changes thoracic spine.
IMPRESSION: No active cardiopulmonary disease.

## 2018-09-30 ENCOUNTER — Encounter: Payer: Self-pay | Admitting: Cardiovascular Disease

## 2018-09-30 ENCOUNTER — Ambulatory Visit: Payer: Medicare HMO | Admitting: Cardiovascular Disease

## 2018-09-30 VITALS — BP 140/90 | HR 83 | Ht 67.0 in | Wt 228.0 lb

## 2018-09-30 DIAGNOSIS — I252 Old myocardial infarction: Secondary | ICD-10-CM | POA: Diagnosis not present

## 2018-09-30 DIAGNOSIS — I10 Essential (primary) hypertension: Secondary | ICD-10-CM

## 2018-09-30 DIAGNOSIS — Z23 Encounter for immunization: Secondary | ICD-10-CM

## 2018-09-30 DIAGNOSIS — Z955 Presence of coronary angioplasty implant and graft: Secondary | ICD-10-CM

## 2018-09-30 DIAGNOSIS — I25118 Atherosclerotic heart disease of native coronary artery with other forms of angina pectoris: Secondary | ICD-10-CM | POA: Diagnosis not present

## 2018-09-30 DIAGNOSIS — Z720 Tobacco use: Secondary | ICD-10-CM

## 2018-09-30 DIAGNOSIS — E785 Hyperlipidemia, unspecified: Secondary | ICD-10-CM

## 2018-09-30 NOTE — Patient Instructions (Signed)
Your physician wants you to follow-up in: 1 year with Dr.Koneswaran You will receive a reminder letter in the mail two months in advance. If you don't receive a letter, please call our office to schedule the follow-up appointment.    STOP Brilinta    Your physician recommends that you continue on your current medications as directed. Please refer to the Current Medication list given to you today.     If you need a refill on your cardiac medications before your next appointment, please call your pharmacy.      No lab work or tests today          Thank you for choosing [facility] !

## 2018-09-30 NOTE — Progress Notes (Signed)
SUBJECTIVE: The patient presents for follow-up of coronary artery disease. She underwent tandem stenting of her proximal to mid LAD in July 2013 by Dr. [name] with rescue PTCA of a jailed diagonal branch. On 12/30/2014 she sustained an inferior wall ST elevation myocardial infarction. Coronary angiography revealed subtotal stenosis with thrombus of the distal RCA beyond the PDA. She underwent successful percutaneous coronary intervention with 1 drug-eluting stent. LV gram showed EF 50-55% with mild inferior wall hypokinesis.  ECG performed in the office today which I ordered and personally interpreted demonstrates normal sinus rhythm with diffuse nonspecific T wave abnormalities inferiorly and V3 through V6 with T wave inversions.  The patient denies any symptoms of chest pain, palpitations, shortness of breath, lightheadedness, dizziness, leg swelling, orthopnea, PND, and syncope.  She tries to walk every evening in the parking eating with her sister.  She smokes about 5 cigarettes a day.  Chantix did not help her.  She is trying gum.    Review of Systems: As per "subjective", otherwise negative.  Allergies  Allergen Reactions  . Lipitor [Atorvastatin] Other (See Comments)    Severe bone aches    Current Outpatient Medications  Medication Sig Dispense Refill  . aspirin EC 81 MG tablet Take 81 mg by mouth daily.     . Aspirin-Salicylamide-Caffeine (BC HEADACHE) 325-95-16 MG TABS Take 1 packet by mouth daily as needed (for pain).    Marland Kitchen b complex vitamins tablet Take 1 tablet by mouth daily.    . benazepril-hydrochlorthiazide (LOTENSIN HCT) 20-25 MG per tablet Take 1 tablet by mouth daily. <please make appointment with cardiologist for refills> 30 tablet 1  . BRILINTA 60 MG TABS tablet TAKE ONE TABLET BY MOUTH TWICE A DAY 60 tablet 2  . carvedilol (COREG) 6.25 MG tablet TAKE ONE TABLET TWICE DAILY 180 tablet 0  . diazepam (VALIUM) 5 MG tablet Take 5 mg by mouth at bedtime as  needed (for sleep).     . docusate sodium (COLACE) 100 MG capsule Take 100 mg by mouth daily.    . ferrous sulfate 325 (65 FE) MG tablet Take 325 mg by mouth daily with breakfast.    . nitroGLYCERIN (NITROSTAT) 0.4 MG SL tablet PLACE ONE TABLET UNDER TONGUE EVERY FIVEMINUTES FOR THREE DOSES AS NEEDED. 25 tablet 1  . Omega-3 Fatty Acids (FISH OIL PO) Take 1 capsule by mouth daily.    . potassium chloride SA (K-DUR,KLOR-CON) 20 MEQ tablet Take 1 tablet (20 mEq total) by mouth 2 (two) times daily. 8 tablet 0  . rosuvastatin (CRESTOR) 5 MG tablet Take 5 mg by mouth every morning.     . varenicline (CHANTIX STARTING MONTH PAK) 0.5 MG X 11 & 1 MG X 42 tablet Take one 0.5 mg tablet by mouth once daily for 3 days, then increase to one 0.5 mg tablet twice daily for 4 days, then increase to one 1 mg tablet twice daily. 53 tablet 0   No current facility-administered medications for this visit.     Past Medical History:  Diagnosis Date  . Arthritis   . Coronary artery disease 06/2012   s/p NSTEMI with 100% LAD occlusion -- 2 Overlappin Xience DES Stents (2.5 mm x 28, 3.0 mm 18 mm)  . Hypertension   . Ischemic cardiomyopathy 06/2012   EF ~40-45%    Past Surgical History:  Procedure Laterality Date  . CARDIAC CATHETERIZATION    . CHOLECYSTECTOMY    . CORONARY STENT PLACEMENT  07/15/12  .  KNEE SURGERY    . LEFT HEART CATHETERIZATION WITH CORONARY ANGIOGRAM N/A 07/13/2012   Procedure: LEFT HEART CATHETERIZATION WITH CORONARY ANGIOGRAM;  Surgeon: Marykay Lex, MD;  Location: [facility] CATH LAB;  Service: Cardiovascular;  Laterality: N/A;  . LEFT HEART CATHETERIZATION WITH CORONARY ANGIOGRAM N/A 11/20/2012   Procedure: LEFT HEART CATHETERIZATION WITH CORONARY ANGIOGRAM;  Surgeon: Marykay Lex, MD;  Location: [facility] CATH LAB;  Service: Cardiovascular;  Laterality: N/A;  . LEFT HEART CATHETERIZATION WITH CORONARY ANGIOGRAM N/A 12/30/2014   Procedure: LEFT HEART CATHETERIZATION WITH CORONARY ANGIOGRAM;  Surgeon:  Lennette Bihari, MD;  Location: [facility] CATH LAB;  Service: Cardiovascular;  Laterality: N/A;  . PERCUTANEOUS CORONARY STENT INTERVENTION (PCI-S)  07/13/2012   Procedure: PERCUTANEOUS CORONARY STENT INTERVENTION (PCI-S);  Surgeon: Marykay Lex, MD;  Location[facility] CATH LAB;  Service: Cardiovascular;;  . PERCUTANEOUS CORONARY STENT INTERVENTION (PCI-S)  12/30/2014   Procedure: PERCUTANEOUS CORONARY STENT INTERVENTION (PCI-S);  Surgeon: Lennette Bihari, MD;  Location: [facility] CATH LAB;  Service: Cardiovascular;;    Social History   Socioeconomic History  . Marital status: Married    Spouse name: Not on file  . Number of children: Not on file  . Years of education: Not on file  . Highest education level: Not on file  Occupational History  . Not on file  Social Needs  . Financial resource strain: Not on file  . Food insecurity:    Worry: Not on file    Inability: Not on file  . Transportation needs:    Medical: Not on file    Non-medical: Not on file  Tobacco Use  . Smoking status: Current Every Day Smoker    Packs/day: 0.50    Years: 20.00    Pack years: 10.00    Types: Cigarettes    Start date: 11/23/1976  . Smokeless tobacco: Never Used  . Tobacco comment: 4 ciggs per day  Substance and Sexual Activity  . Alcohol use: No    Alcohol/week: 0.0 standard drinks  . Drug use: Yes    Types: Marijuana  . Sexual activity: Yes  Lifestyle  . Physical activity:    Days per week: Not on file    Minutes per session: Not on file  . Stress: Not on file  Relationships  . Social connections:    Talks on phone: Not on file    Gets together: Not on file    Attends religious service: Not on file    Active member of club or organization: Not on file    Attends meetings of clubs or organizations: Not on file    Relationship status: Not on file  . Intimate partner violence:    Fear of current or ex partner: Not on file    Emotionally abused: Not on file    Physically abused: Not on file    Forced  sexual activity: Not on file  Other Topics Concern  . Not on file  Social History Narrative  . Not on file     Vitals:   09/30/18 0816  BP: 140/90  Pulse: 83  SpO2: 97%  Weight: 228 lb (103.4 kg)  Height: 5\' 7"  (1.702 m)    Wt Readings from Last 3 Encounters:  09/30/18 228 lb (103.4 kg)  08/05/17 222 lb (100.7 kg)  07/18/17 220 lb (99.8 kg)     PHYSICAL EXAM General: NAD HEENT: Normal. Neck: No JVD, no thyromegaly. Lungs: Clear to auscultation bilaterally with normal respiratory effort. CV: Regular rate and  rhythm, normal S1/S2, no S3/S4, no murmur. No pretibial or periankle edema.  No carotid bruit.   Abdomen: Soft, nontender, no distention.  Neurologic: Alert and oriented.  Psych: Normal affect. Skin: Normal. Musculoskeletal: No gross deformities.    ECG: Reviewed above under Subjective   Labs: Lab Results  Component Value Date/Time   K 2.8 (L) 07/18/2017 05:13 PM   BUN 11 07/18/2017 05:13 PM   CREATININE 0.90 07/18/2017 05:13 PM   ALT 14 07/18/2017 05:13 PM   HGB 12.4 07/18/2017 05:13 PM     Lipids: Lab Results  Component Value Date/Time   LDLCALC 88 07/13/2012 05:05 AM   CHOL 165 07/13/2012 05:05 AM   TRIG 43 07/13/2012 05:05 AM   HDL 68 07/13/2012 05:05 AM       ASSESSMENT AND PLAN: 1. CAD s/p RCA DES with inferior STEMI: Symptomatically stable. Continue ASA, Coreg, and Crestor.  I will discontinue Brilinta.  2. Essential HTN: Mildly elevated on current dose of Coreg and benazepril-HCTZ. No changes.  I encouraged increased physical activity for weight loss.  3. Hyperlipidemia: Will obtain copy of lipids. Continue current dose of statin therapy.  4. Tobacco abuse: She smokes 5 cigarettes a day.  Chantix did not help her.  She is trying gum.    Disposition: Follow up 1 year   Prentice Docker, M.D., F.A.C.C.

## 2018-11-10 ENCOUNTER — Other Ambulatory Visit: Payer: Self-pay | Admitting: Cardiovascular Disease

## 2019-05-27 ENCOUNTER — Telehealth: Payer: Self-pay | Admitting: Cardiovascular Disease

## 2019-05-27 NOTE — Telephone Encounter (Signed)
Will forward to Dr. Purvis Sheffield to see if he can advise on ideas to help pt stop smoking.

## 2019-05-27 NOTE — Telephone Encounter (Signed)
Called pt- No answer, left detailed message

## 2019-05-27 NOTE — Telephone Encounter (Signed)
Patient called stating that she would like to see if Dr. Purvis Sheffield would give her a referral to help her stop smoking.  [phone].

## 2019-05-27 NOTE — Telephone Encounter (Signed)
Ask her to call [phone].

## 2019-07-20 NOTE — Progress Notes (Signed)
[facility] - Clinic Note  07/21/2019     CHIEF COMPLAINT Patient presents for Retina Evaluation   HISTORY OF PRESENT ILLNESS: [name] is a 63 y.o. female who presents to the clinic today for:   HPI    Retina Evaluation    In left eye.  Onset: Unknown.  Duration of 1 week.  Associated Symptoms Flashes and Blind Spot.  Context:  distance vision, mid-range vision and near vision.  Treatments tried include eye drops.  Response to treatment was no improvement.  I, the attending physician,  performed the HPI with the patient and updated documentation appropriately.          Comments    63 y/o female pt referred by Dr. Conley Rolls for eval of optic nerve head pallor OS.  Pt saw Dr. Conley Rolls about 1 wk ago.  VA good OD cc.  VA blurred OS for some time.  Feels like there is a constant "film" over her central vision OS; symptom seems a bit worse first thing in the morning.  Denies pain, floaters.  Has intermittent flashes, but can't tell which eye they're in.  Last occurred about 2 months ago.  Has been told in the past she is a [facility] suspect.  Pt's husband died about 2 months ago.  AT prn OU.       Last edited by Rennis Chris, MD on 07/21/2019  8:38 AM. (History)     Patient c/o "film" over vision OS. OS feels dry and irritated. First noticed 2020-05-31but may have changed prior to May 30, 2023 as patient's husband was sick, requiring around the clock care. Husband died in 05/31/20and patient started paying more attention to symptoms. History of cardiac problems/blockage, but patient denies any other problems with circulation. Patient states blood pressure under good control. Currently smokes. History of high cholesterol. Denies history of diabetes. Denies history of cancer. Denies headache. Sister has history of brain mass.   Referring physician: Michaelle Copas, MD [address] [place],  [place] [address]  HISTORICAL INFORMATION:   Selected notes from the MEDICAL RECORD NUMBER Referred by  Dr. 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 (Ophthalmic Drugs)   No current facility-administered medications for this visit.  (Ophthalmic Drugs)   Current Outpatient Medications (Other)  Medication Sig  . aspirin EC 81 MG tablet Take 81 mg by mouth daily.   . Aspirin-Salicylamide-Caffeine (BC HEADACHE) 325-95-16 MG TABS Take 1 packet by mouth daily as needed (for pain).  Marland Kitchen b complex vitamins tablet Take 1 tablet by mouth daily.  . benazepril-hydrochlorthiazide (LOTENSIN HCT) 20-25 MG per tablet Take 1 tablet by mouth daily. <please make appointment with cardiologist for refills>  . carvedilol (COREG) 6.25 MG tablet TAKE ONE TABLET TWICE DAILY  . diazepam (VALIUM) 5 MG tablet Take 5 mg by mouth at bedtime as needed (for sleep).   . docusate sodium (COLACE) 100 MG capsule Take 100 mg by mouth daily.  . ferrous sulfate 325 (65 FE) MG tablet Take 325 mg by mouth daily with breakfast.  . nitroGLYCERIN (NITROSTAT) 0.4 MG SL tablet PLACE ONE TABLET UNDER TONGUE EVERY FIVEMINUTES FOR THREE DOSES AS NEEDED.  Marland Kitchen Omega-3 Fatty Acids (FISH OIL PO) Take 1 capsule by mouth daily.  Marland Kitchen oseltamivir (TAMIFLU) 75 MG capsule   . potassium chloride SA (K-DUR,KLOR-CON) 20 MEQ tablet Take 1  tablet (20 mEq total) by mouth 2 (two) times daily.  . rosuvastatin (CRESTOR) 5 MG tablet Take 5 mg by mouth every morning.   . varenicline (CHANTIX STARTING MONTH PAK) 0.5 MG X 11 & 1 MG X 42 tablet Take one 0.5 mg tablet by mouth once daily for 3 days, then increase to one 0.5 mg tablet twice daily for 4 days, then increase to one 1 mg tablet twice daily.   No current facility-administered medications for this visit.  (Other)      REVIEW OF SYSTEMS: ROS    Positive for: Musculoskeletal, Cardiovascular, Eyes   Negative for: Constitutional, Gastrointestinal, Neurological, Skin, Genitourinary, HENT,  Endocrine, Respiratory, Psychiatric, Allergic/Imm, Heme/Lymph   Last edited by Rennis ChrisZamora, Zayra Devito, MD on 07/21/2019  8:49 AM. (History)       ALLERGIES Allergies  Allergen Reactions  . Lipitor [Atorvastatin] Other (See Comments)    Severe bone aches    PAST MEDICAL HISTORY Past Medical History:  Diagnosis Date  . Arthritis   . Coronary artery disease 06/2012   s/p NSTEMI with 100% LAD occlusion -- 2 Overlappin Xience DES Stents (2.5 mm x 28, 3.0 mm 18 mm)  . Hypertension   . Ischemic cardiomyopathy 06/2012   EF ~40-45%   Past Surgical History:  Procedure Laterality Date  . CARDIAC CATHETERIZATION    . CHOLECYSTECTOMY    . CORONARY STENT PLACEMENT  07/15/12  . KNEE SURGERY    . LEFT HEART CATHETERIZATION WITH CORONARY ANGIOGRAM N/A 07/13/2012   Procedure: LEFT HEART CATHETERIZATION WITH CORONARY ANGIOGRAM;  Surgeon: Marykay Lexavid W Harding, MD;  [facility];  Service: Cardiovascular;  Laterality: N/A;  . LEFT HEART CATHETERIZATION WITH CORONARY ANGIOGRAM N/A 11/20/2012   Procedure: LEFT HEART CATHETERIZATION WITH CORONARY ANGIOGRAM;  Surgeon: Marykay Lexavid W Harding, MD;  [facility];  Service: Cardiovascular;  Laterality: N/A;  . LEFT HEART CATHETERIZATION WITH CORONARY ANGIOGRAM N/A 12/30/2014   Procedure: LEFT HEART CATHETERIZATION WITH CORONARY ANGIOGRAM;  Surgeon: Lennette Biharihomas A Kelly, MD;  [facility];  Service: Cardiovascular;  Laterality: N/A;  . PERCUTANEOUS CORONARY STENT INTERVENTION (PCI-S)  07/13/2012   Procedure: PERCUTANEOUS CORONARY STENT INTERVENTION (PCI-S);  Surgeon: Marykay Lexavid W Harding, MD;  [facility];  Service: Cardiovascular;;  . PERCUTANEOUS CORONARY STENT INTERVENTION (PCI-S)  12/30/2014   Procedure: PERCUTANEOUS CORONARY STENT INTERVENTION (PCI-S);  Surgeon: Lennette Biharihomas A Kelly, MD;  [facility];  Service: Cardiovascular;;    FAMILY HISTORY Family History  Problem Relation Age of Onset  . Hypertension Mother   . Hypertension Father   .  Hypertension Sister   . Hypertension Brother   . Coronary artery disease Brother [id number]    SOCIAL HISTORY Social History   Tobacco Use  . Smoking status: Current Every Day Smoker    Packs/day: 0.50    Years: 20.00    Pack years: 10.00    Types: Cigarettes    Start date: 11/23/1976  . Smokeless tobacco: Never Used  . Tobacco comment: 4 ciggs per day  Substance Use Topics  . Alcohol use: No    Alcohol/week: 0.0 standard drinks  . Drug use: Yes    Types: Marijuana         OPHTHALMIC EXAM:  Base Eye Exam    Visual Acuity (Snellen - Linear)      Right Left   Dist cc 20/30 +2 20/60 -2   Dist ph cc NI NI   Correction: Glasses       Tonometry (Tonopen,  8:06 AM)      Right Left   Pressure 10 9       Pupils      Dark Light Shape React APD   Right 3 2 Round Minimal None   Left 3 3.5 Round Minimal Trace       Visual Fields (Counting fingers)      Left Right     Full   Restrictions Total superior temporal deficiency; Partial outer inferior temporal, superior nasal, inferior nasal deficiencies        Extraocular Movement      Right Left    Full, Ortho Full, Ortho       Neuro/Psych    Oriented x3: Yes   Mood/Affect: Normal       Dilation    Both eyes: 1.0% Mydriacyl, 2.5% Phenylephrine @ 8:07 AM        Slit Lamp and Fundus Exam    Slit Lamp Exam      Right Left   Lids/Lashes Dermatochalasis - upper lid Dermatochalasis - upper lid   Conjunctiva/Sclera nasal pinguecula, melanosis nasal and temporal pinguecula   Cornea 3+ Punctate epithelial erosions mild tear film debris , mild arcus, 1-2 +Punctate epithelial erosions   Anterior Chamber deep and clear deep and clear   Iris Round and dilated, no NVI round and dilated, no NVI   Lens 2+ ns, 2-3+ cortical 2+ ns, 2-3+ cortical   Vitreous syneresis syneresis       Fundus Exam      Right Left   Disc 1-2+ edema 3-4 + pallor, vascular loops on nasal side, 1+ disc edema   C/D Ratio 0.2 0.4   Macula flat, good  foveal reflex, no heme or edema flat, blunted foveal reflex, mild RPE mottling, no heme or edema   Vessels severe tortuosity, Vascular attenuation, copper wiring tortuos   Periphery attached, no heme attached, no heme        Refraction    Wearing Rx      Sphere Cylinder Axis Add   Right +4.50 Sphere  +2.25   Left +3.00 +0.75 172 +2.25   Age: [age]yr   Type: Bifocal       Manifest Refraction      Sphere Cylinder Axis Dist VA   Right +4.50 +0.25 018 20/25-   Left +3.00 +0.50 173 20/60-2          IMAGING AND PROCEDURES  Imaging and Procedures for @TODAY @  OCT, Retina - OU - Both Eyes       Right Eye Quality was good. Central Foveal Thickness: 250. Progression has no prior data. Findings include normal foveal contour, no SRF (Elevation of disc).   Left Eye Quality was good. Central Foveal Thickness: 253. Progression has no prior data. Findings include normal foveal contour, no SRF, intraretinal fluid, inner retinal atrophy (Tr perifoveal cystic changes; mild inner retinal atrophy).   Notes *Images captured and stored on drive  Diagnosis / Impression:  OD: NFP; no IRF/SRF OS: NFP; tr cystic changes ?inner retinal atrophy  Clinical management:  See below  Abbreviations: NFP - Normal foveal profile. CME - cystoid macular edema. PED - pigment epithelial detachment. IRF - intraretinal fluid. SRF - subretinal fluid. EZ - ellipsoid zone. ERM - epiretinal membrane. ORA - outer retinal atrophy. ORT - outer retinal tubulation. SRHM - subretinal hyper-reflective material        Fluorescein Angiography Optos (Transit OS)       Right Eye   Progression has no prior  data. Early phase findings include staining. Mid/Late phase findings include staining, vascular perfusion defect (Peripheral vascular perfusion defect, staining of optic disc).   Left Eye   Progression has no prior data. Early phase findings include delayed filling, staining. Mid/Late phase findings include  staining, vascular perfusion defect (Peripheral vascular perfusion defect, staining of optic disc).   Notes **Images stored on drive**  Impression: OD: Hyperfluorescence of disc, mild peripheral perfusion defect  OS: Ischemic optic neuropathy, mild peripheral perfusion defect                 ASSESSMENT/PLAN:    ICD-10-CM   1. 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 Optic disc edema  H47.10   3. Retinal edema  H35.81 OCT, Retina - OU - Both Eyes  4. Essential hypertension  I10   5. Hypertensive retinopathy of both eyes  H35.033 Fluorescein Angiography Optos (Transit OS)  6. Combined forms of age-related cataract of both eyes  H25.813     1,2. 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 Karleen HampshireSpencer for Neuro-ophthalmology evaluation and management  - f/u with Dr. Vanessa BarbaraZamora in 8 weeks  3. No retinal edema on exam or OCT OU, trace cystic changes OS  4,5. Hypertensive retinopathy OU - discussed importance of tight BP control - monitor  6. Combined form of cataracts OU  - The symptoms of cataract, surgical options, and treatments and risks were discussed with patient. - discussed diagnosis and progression - not yet visually significant - monitor for now   Ophthalmic Meds Ordered this visit:  No orders of the defined types were placed in this encounter.      Return in about 8 weeks (around 09/15/2019) for Dilated Exam, OCT.  There are no Patient Instructions on file for this visit.   Explained the diagnoses, plan, and follow up with the patient and they expressed  understanding.  Patient expressed understanding of the importance of proper follow up care.   This document serves as a record of services personally performed by Karie ChimeraBrian G. Nakeeta Sebastiani, MD, PhD. It was created on their behalf by Laurian BrimAmanda Brown, OA, an ophthalmic assistant. The creation of this record is the provider's dictation and/or activities during the visit.    Electronically signed by: Laurian BrimAmanda Brown, OA  07.21.2020 9:50 AM    Karie ChimeraBrian G. Sabah Zucco, M.D., Ph.D. Diseases & Surgery of the Retina and Vitreous [facility]  I have reviewed the above documentation for accuracy and completeness, and I agree with the above. Karie ChimeraBrian G. Nithya Meriweather, M.D., Ph.D. 07/21/19 9:50 AM    Abbreviations: M myopia (nearsighted); A astigmatism; H hyperopia (farsighted); P presbyopia; Mrx spectacle prescription;  CTL contact lenses; OD right eye; OS left eye; OU both eyes  XT exotropia; ET esotropia; PEK punctate epithelial keratitis; PEE punctate epithelial erosions; DES dry eye syndrome; MGD meibomian gland dysfunction; ATs artificial tears; PFAT's preservative free artificial tears; NSC nuclear sclerotic cataract; PSC posterior subcapsular cataract; ERM epi-retinal membrane; PVD posterior vitreous detachment; RD retinal detachment; DM diabetes mellitus; DR diabetic retinopathy; NPDR non-proliferative diabetic retinopathy; PDR proliferative  diabetic retinopathy; CSME clinically significant macular edema; DME diabetic macular edema; dbh dot blot hemorrhages; CWS cotton wool spot; POAG primary open angle glaucoma; C/D cup-to-disc ratio; HVF humphrey visual field; GVF goldmann visual field; OCT optical coherence tomography; IOP intraocular pressure; BRVO Branch retinal vein occlusion; CRVO central retinal vein occlusion; CRAO central retinal artery occlusion; BRAO branch retinal artery occlusion; RT retinal tear; SB scleral buckle; PPV pars plana vitrectomy; VH Vitreous hemorrhage; PRP panretinal laser  photocoagulation; IVK intravitreal kenalog; VMT vitreomacular traction; MH Macular hole;  NVD neovascularization of the disc; NVE neovascularization elsewhere; AREDS age related eye disease study; ARMD age related macular degeneration; POAG primary open angle glaucoma; EBMD epithelial/anterior basement membrane dystrophy; ACIOL anterior chamber intraocular lens; IOL intraocular lens; PCIOL posterior chamber intraocular lens; Phaco/IOL phacoemulsification with intraocular lens placement; Trilby photorefractive keratectomy; LASIK laser assisted in situ keratomileusis; HTN hypertension; DM diabetes mellitus; COPD chronic obstructive pulmonary disease

## 2019-07-21 ENCOUNTER — Other Ambulatory Visit: Payer: Self-pay

## 2019-07-21 ENCOUNTER — Encounter (INDEPENDENT_AMBULATORY_CARE_PROVIDER_SITE_OTHER): Payer: Self-pay | Admitting: Ophthalmology

## 2019-07-21 ENCOUNTER — Ambulatory Visit (INDEPENDENT_AMBULATORY_CARE_PROVIDER_SITE_OTHER): Payer: Medicare HMO | Admitting: Ophthalmology

## 2019-07-21 DIAGNOSIS — H3581 Retinal edema: Secondary | ICD-10-CM

## 2019-07-21 DIAGNOSIS — I10 Essential (primary) hypertension: Secondary | ICD-10-CM

## 2019-07-21 DIAGNOSIS — H4710 Unspecified papilledema: Secondary | ICD-10-CM

## 2019-07-21 DIAGNOSIS — H35033 Hypertensive retinopathy, bilateral: Secondary | ICD-10-CM

## 2019-07-21 DIAGNOSIS — H469 Unspecified optic neuritis: Secondary | ICD-10-CM | POA: Diagnosis not present

## 2019-07-21 DIAGNOSIS — H25813 Combined forms of age-related cataract, bilateral: Secondary | ICD-10-CM

## 2019-09-15 ENCOUNTER — Encounter (INDEPENDENT_AMBULATORY_CARE_PROVIDER_SITE_OTHER): Payer: Medicare HMO | Admitting: Ophthalmology

## 2019-09-29 ENCOUNTER — Telehealth: Payer: Self-pay | Admitting: Cardiovascular Disease

## 2019-09-29 NOTE — Telephone Encounter (Signed)

## 2019-10-01 ENCOUNTER — Other Ambulatory Visit (HOSPITAL_COMMUNITY): Payer: Self-pay | Admitting: Family Medicine

## 2019-10-01 ENCOUNTER — Telehealth (INDEPENDENT_AMBULATORY_CARE_PROVIDER_SITE_OTHER): Payer: Medicare HMO | Admitting: Cardiovascular Disease

## 2019-10-01 ENCOUNTER — Encounter: Payer: Self-pay | Admitting: Cardiovascular Disease

## 2019-10-01 VITALS — BP 138/82 | HR 80 | Ht 64.0 in | Wt 225.0 lb

## 2019-10-01 DIAGNOSIS — I10 Essential (primary) hypertension: Secondary | ICD-10-CM

## 2019-10-01 DIAGNOSIS — I25118 Atherosclerotic heart disease of native coronary artery with other forms of angina pectoris: Secondary | ICD-10-CM | POA: Diagnosis not present

## 2019-10-01 DIAGNOSIS — Z720 Tobacco use: Secondary | ICD-10-CM

## 2019-10-01 DIAGNOSIS — Z955 Presence of coronary angioplasty implant and graft: Secondary | ICD-10-CM

## 2019-10-01 DIAGNOSIS — E785 Hyperlipidemia, unspecified: Secondary | ICD-10-CM

## 2019-10-01 DIAGNOSIS — N63 Unspecified lump in unspecified breast: Secondary | ICD-10-CM

## 2019-10-01 DIAGNOSIS — I252 Old myocardial infarction: Secondary | ICD-10-CM

## 2019-10-01 NOTE — Patient Instructions (Signed)
Medication Instructions: Your physician recommends that you continue on your current medications as directed. Please refer to the Current Medication list given to you today.   Labwork: None today  Procedures/Testing: None  Follow-Up:  6 months in office with Dr.Koneswaran  Any Additional Special Instructions Will Be Listed Below (If Applicable).     If you need a refill on your cardiac medications before your next appointment, please call your pharmacy.     Thank you for choosing [facility] !

## 2019-10-01 NOTE — Progress Notes (Signed)
Virtual Visit via Telephone Note   This visit type was conducted due to national recommendations for restrictions regarding the COVID-19 Pandemic (e.g. social distancing) in an effort to limit this patient's exposure and mitigate transmission in our community.  Due to her co-morbid illnesses, this patient is at least at moderate risk for complications without adequate follow up.  This format is felt to be most appropriate for this patient at this time.  The patient did not have access to video technology/had technical difficulties with video requiring transitioning to audio format only (telephone).  All issues noted in this document were discussed and addressed.  No physical exam could be performed with this format.  Please refer to the patient's chart for her  consent to telehealth for [facility].   Date:  10/01/2019   ID:  [name], DOB 01-12-1956, MRN [id number]  Patient Location: Home Provider Location: Home  PCP:  Gareth Morgan, MD  Cardiologist:  Prentice Docker, MD  Electrophysiologist:  None   Evaluation Performed:  Follow-Up Visit  Chief Complaint:  CAD  History of Present Illness:    [name] is a 63 y.o. female with coronary artery disease. She underwent tandem stenting of her proximal to mid LAD in July 2013 by Dr. [name] with rescue PTCA of a jailed diagonal branch. On 12/30/2014 she sustained an inferior wall ST elevation myocardial infarction. Coronary angiography revealed subtotal stenosis with thrombus of the distal RCA beyond the PDA. She underwent successful percutaneous coronary intervention with 1 drug-eluting stent. LV gram showed EF 50-55% with mild inferior wall hypokinesis.  The patient denies any symptoms of chest pain, palpitations, shortness of breath, lightheadedness, dizziness, leg swelling, orthopnea, PND, and syncope.  The patient does not have symptoms concerning for COVID-19 infection (fever, chills, cough, or new shortness of  breath).   Her husband passed away in 06-08-2019. He died of COPD.  She almost quit smoking before her husband's passing.  She is doing ok. She has a strong faith.  She continues to stay active.   Soc Hx: Her husband passed away on 2019-06-03. They were married for 32 years. 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   Past Medical History:  Diagnosis Date  . Arthritis   . Coronary artery disease 06/2012   s/p NSTEMI with 100% LAD occlusion -- 2 Overlappin Xience DES Stents (2.5 mm x 28, 3.0 mm 18 mm)  . Hypertension   . Ischemic cardiomyopathy 06/2012   EF ~40-45%   Past Surgical History:  Procedure Laterality Date  . CARDIAC CATHETERIZATION    . CHOLECYSTECTOMY    . CORONARY STENT PLACEMENT  07/15/12  . KNEE SURGERY    . LEFT HEART CATHETERIZATION WITH CORONARY ANGIOGRAM N/A 07/13/2012   Procedure: LEFT HEART CATHETERIZATION WITH CORONARY ANGIOGRAM;  Surgeon: Marykay Lex, MD;  Location: [facility] CATH LAB;  Service: Cardiovascular;  Laterality: N/A;  . LEFT HEART CATHETERIZATION WITH CORONARY ANGIOGRAM N/A 11/20/2012   Procedure: LEFT HEART CATHETERIZATION WITH CORONARY ANGIOGRAM;  Surgeon: Marykay Lex, MD;  Location: [facility] CATH LAB;  Service: Cardiovascular;  Laterality: N/A;  . LEFT HEART CATHETERIZATION WITH CORONARY ANGIOGRAM N/A 12/30/2014   Procedure: LEFT HEART CATHETERIZATION WITH CORONARY ANGIOGRAM;  Surgeon: Lennette Bihari, MD;  Location: [facility] LAB;  Service: Cardiovascular;  Laterality: N/A;  . PERCUTANEOUS CORONARY STENT INTERVENTION (PCI-S)  07/13/2012   Procedure: PERCUTANEOUS CORONARY STENT INTERVENTION (PCI-S);  Surgeon: Marykay Lex, MD;  Location: [facility] CATH LAB;  Service: Cardiovascular;;  . PERCUTANEOUS CORONARY STENT INTERVENTION (PCI-S)  12/30/2014   Procedure: PERCUTANEOUS CORONARY STENT INTERVENTION (PCI-S);  Surgeon: Lennette Biharihomas A Kelly, MD;  [facility];  Service: Cardiovascular;;     Current Meds  Medication Sig  . aspirin EC  81 MG tablet Take 81 mg by mouth daily.   . Aspirin-Salicylamide-Caffeine (BC HEADACHE) 325-95-16 MG TABS Take 1 packet by mouth daily as needed (for pain).  Marland Kitchen. b complex vitamins tablet Take 1 tablet by mouth daily.  . benazepril-hydrochlorthiazide (LOTENSIN HCT) 20-25 MG per tablet Take 1 tablet by mouth daily. <please make appointment with cardiologist for refills>  . carvedilol (COREG) 6.25 MG tablet TAKE ONE TABLET TWICE DAILY  . diazepam (VALIUM) 5 MG tablet Take 5 mg by mouth at bedtime as needed (for sleep).   . docusate sodium (COLACE) 100 MG capsule Take 100 mg by mouth daily.  . ferrous sulfate 325 (65 FE) MG tablet Take 325 mg by mouth daily with breakfast.  . nitroGLYCERIN (NITROSTAT) 0.4 MG SL tablet PLACE ONE TABLET UNDER TONGUE EVERY FIVEMINUTES FOR THREE DOSES AS NEEDED.  Marland Kitchen. Omega-3 Fatty Acids (FISH OIL PO) Take 1 capsule by mouth daily.  Marland Kitchen. oseltamivir (TAMIFLU) 75 MG capsule   . potassium chloride SA (K-DUR,KLOR-CON) 20 MEQ tablet Take 1 tablet (20 mEq total) by mouth 2 (two) times daily.  . rosuvastatin (CRESTOR) 5 MG tablet Take 5 mg by mouth every morning.   . varenicline (CHANTIX STARTING MONTH PAK) 0.5 MG X 11 & 1 MG X 42 tablet Take one 0.5 mg tablet by mouth once daily for 3 days, then increase to one 0.5 mg tablet twice daily for 4 days, then increase to one 1 mg tablet twice daily.     Allergies:   Lipitor [atorvastatin]   Social History   Tobacco Use  . Smoking status: Current Every Day Smoker    Packs/day: 0.50    Years: 20.00    Pack years: 10.00    Types: Cigarettes    Start date: 11/23/1976  . Smokeless tobacco: Never Used  . Tobacco comment: 4 ciggs per day  Substance Use Topics  . Alcohol use: No    Alcohol/week: 0.0 standard drinks  . Drug use: Yes    Types: Marijuana     Family Hx: The patient's family history includes Coronary artery disease (age of onset: 2455) in her brother; Hypertension in her brother, father, mother, and sister.  ROS:    Please see the history of present illness.     All other systems reviewed and are negative.   Prior CV studies:   The following studies were reviewed today:  Reviewed above  Labs/Other Tests and Data Reviewed:    EKG:  No ECG reviewed.  Recent Labs: No results found for requested labs within last 8760 hours.   Recent Lipid Panel Lab Results  Component Value Date/Time   CHOL 165 07/13/2012 05:05 AM   TRIG 43 07/13/2012 05:05 AM   HDL 68 07/13/2012 05:05 AM   CHOLHDL 2.4 07/13/2012 05:05 AM   LDLCALC 88 07/13/2012 05:05 AM    Wt Readings from Last 3 Encounters:  10/01/19 225 lb (102.1 kg)  09/30/18 228 lb (103.4 kg)  08/05/17 222 lb (100.7 kg)     Objective:    Vital Signs:  BP 138/82   Pulse 80   Ht 5\' 4"  (1.626 m)   Wt 225 lb (102.1 kg)   BMI 38.62 kg/m  VITAL SIGNS:  reviewed  ASSESSMENT & PLAN:    1. CAD s/p RCA DES with inferior STEMI: Symptomatically stable. Continue ASA, Coreg, and rosuvastatin.    2. Essential HTN: BP normal on current dose of Coreg and benazepril-HCTZ. No changes.  I encouraged increased physical activity for weight loss.  3. Hyperlipidemia: Will obtain copy of lipids. Continue current dose of rosuvastatin.  4. Tobacco abuse: She almost quit smoking before her husband's passing. She smokes about a half pack daily. Chantix did not help.     COVID-19 Education: The signs and symptoms of COVID-19 were discussed with the patient and how to seek care for testing (follow up with PCP or arrange E-visit).  The importance of social distancing was discussed today.  Time:   Today, I have spent 10 minutes with the patient with telehealth technology discussing the above problems.     Medication Adjustments/Labs and Tests Ordered: Current medicines are reviewed at length with the patient today.  Concerns regarding medicines are outlined above.   Tests Ordered: No orders of the defined types were placed in this encounter.    Medication Changes: No orders of the defined types were placed in this encounter.   Follow Up:  In Person in 6 month(s)  Signed, Kate Sable, MD  10/01/2019 10:06 AM    [place]

## 2019-10-19 ENCOUNTER — Other Ambulatory Visit (HOSPITAL_COMMUNITY): Payer: Self-pay | Admitting: Family Medicine

## 2019-10-19 DIAGNOSIS — N63 Unspecified lump in unspecified breast: Secondary | ICD-10-CM

## 2019-10-26 ENCOUNTER — Ambulatory Visit (HOSPITAL_COMMUNITY): Payer: Medicare HMO

## 2019-10-26 ENCOUNTER — Ambulatory Visit (HOSPITAL_COMMUNITY): Admission: RE | Admit: 2019-10-26 | Payer: Medicare HMO | Source: Ambulatory Visit

## 2019-10-26 ENCOUNTER — Other Ambulatory Visit: Payer: Self-pay

## 2019-10-26 ENCOUNTER — Ambulatory Visit (HOSPITAL_COMMUNITY)
Admission: RE | Admit: 2019-10-26 | Discharge: 2019-10-26 | Disposition: A | Discharge status: Home / Self Care | Payer: Medicare HMO | Source: Ambulatory Visit | Attending: Family Medicine | Admitting: Family Medicine

## 2019-10-26 DIAGNOSIS — Z872 Personal history of diseases of the skin and subcutaneous tissue: Secondary | ICD-10-CM | POA: Diagnosis not present

## 2019-10-26 DIAGNOSIS — N63 Unspecified lump in unspecified breast: Secondary | ICD-10-CM

## 2019-10-26 DIAGNOSIS — Z09 Encounter for follow-up examination after completed treatment for conditions other than malignant neoplasm: Secondary | ICD-10-CM | POA: Diagnosis not present

## 2019-11-18 ENCOUNTER — Encounter: Payer: Self-pay | Admitting: Cardiovascular Disease

## 2020-02-15 ENCOUNTER — Other Ambulatory Visit: Payer: Self-pay | Admitting: Cardiovascular Disease

## 2020-05-03 ENCOUNTER — Ambulatory Visit (INDEPENDENT_AMBULATORY_CARE_PROVIDER_SITE_OTHER): Payer: Medicare HMO | Admitting: Cardiovascular Disease

## 2020-05-03 ENCOUNTER — Other Ambulatory Visit: Payer: Self-pay

## 2020-05-03 ENCOUNTER — Encounter: Payer: Self-pay | Admitting: Cardiovascular Disease

## 2020-05-03 ENCOUNTER — Telehealth: Payer: Self-pay

## 2020-05-03 VITALS — BP 132/84 | HR 72 | Ht 67.0 in | Wt 224.6 lb

## 2020-05-03 DIAGNOSIS — I252 Old myocardial infarction: Secondary | ICD-10-CM | POA: Diagnosis not present

## 2020-05-03 DIAGNOSIS — I25118 Atherosclerotic heart disease of native coronary artery with other forms of angina pectoris: Secondary | ICD-10-CM | POA: Diagnosis not present

## 2020-05-03 DIAGNOSIS — I10 Essential (primary) hypertension: Secondary | ICD-10-CM

## 2020-05-03 DIAGNOSIS — Z955 Presence of coronary angioplasty implant and graft: Secondary | ICD-10-CM

## 2020-05-03 DIAGNOSIS — Z720 Tobacco use: Secondary | ICD-10-CM

## 2020-05-03 DIAGNOSIS — E785 Hyperlipidemia, unspecified: Secondary | ICD-10-CM

## 2020-05-03 NOTE — Telephone Encounter (Signed)
  Patient Consent for Virtual Visit         [name] has provided verbal consent on 05/03/2020 for a virtual visit (video or telephone).   CONSENT FOR VIRTUAL VISIT FOR:  [name]  By participating in this virtual visit I agree to the following:  I hereby voluntarily request, consent and authorize [facility] and its employed or contracted physicians, physician assistants, nurse practitioners or other licensed health care professionals (the Practitioner), to provide me with telemedicine health care services (the "Services") as deemed necessary by the treating Practitioner. I acknowledge and consent to receive the Services by the Practitioner via telemedicine. I understand that the telemedicine visit will involve communicating with the Practitioner through live audiovisual communication technology and the disclosure of certain medical information by electronic transmission. I acknowledge that I have been given the opportunity to request an in-person assessment or other available alternative prior to the telemedicine visit and am voluntarily participating in the telemedicine visit.  I understand that I have the right to withhold or withdraw my consent to the use of telemedicine in the course of my care at any time, without affecting my right to future care or treatment, and that the Practitioner or I may terminate the telemedicine visit at any time. I understand that I have the right to inspect all information obtained and/or recorded in the course of the telemedicine visit and may receive copies of available information for a reasonable fee.  I understand that some of the potential risks of receiving the Services via telemedicine include:  Marland Kitchen Delay or interruption in medical evaluation due to technological equipment failure or disruption; . Information transmitted may not be sufficient (e.g. poor resolution of images) to allow for appropriate medical decision making by the Practitioner;  and/or  . In rare instances, security protocols could fail, causing a breach of personal health information.  Furthermore, I acknowledge that it is my responsibility to provide information about my medical history, conditions and care that is complete and accurate to the best of my ability. I acknowledge that Practitioner's advice, recommendations, and/or decision may be based on factors not within their control, such as incomplete or inaccurate data provided by me or distortions of diagnostic images or specimens that may result from electronic transmissions. I understand that the practice of medicine is not an exact science and that Practitioner makes no warranties or guarantees regarding treatment outcomes. I acknowledge that a copy of this consent can be made available to me via my patient portal [facility] MyChart), or I can request a printed copy by calling the office of [facility].    I understand that my insurance will be billed for this visit.   I have read or had this consent read to me. . I understand the contents of this consent, which adequately explains the benefits and risks of the Services being provided via telemedicine.  . I have been provided ample opportunity to ask questions regarding this consent and the Services and have had my questions answered to my satisfaction. . I give my informed consent for the services to be provided through the use of telemedicine in my medical care

## 2020-05-03 NOTE — Progress Notes (Signed)
SUBJECTIVE: [name] is a 64 y.o. female with coronary artery disease. She underwent tandem stenting of her proximal to mid LAD in July 2013 by Dr. Ellyn Stanley with rescue PTCA of a jailed diagonal branch. On 12/30/2014 she sustained an inferior wall ST elevation myocardial infarction. Coronary angiography revealed subtotal stenosis with thrombus of the distal RCA beyond the PDA. She underwent successful percutaneous coronary intervention with 1 drug-eluting stent. LV gram showed EF 50-55% with mild inferior wall hypokinesis.  She has been going through a rough time.  It is coming up on her husband's 1 year death anniversary.  Her sister passed away at the age of 58 on 12-13-2019.  One of her best friend's husband passed away this past week.  She continues to stay active and walks her terrier.  She also gardens and is planting some beans.  The patient denies any symptoms of chest pain, palpitations, shortness of breath, lightheadedness, dizziness, leg swelling, orthopnea, PND, and syncope.     Soc Hx: Her husband passed away on May 23, 2019. They were married for 32 years.  Her sister passed away on 2019-12-13.  She has a strong faith.  Review of Systems: As per "subjective", otherwise negative.  Allergies  Allergen Reactions  . Lipitor [Atorvastatin] Other (See Comments)    Severe bone aches    Current Outpatient Medications  Medication Sig Dispense Refill  . aspirin EC 81 MG tablet Take 81 mg by mouth daily.     . Aspirin-Salicylamide-Caffeine (BC HEADACHE) 325-95-16 MG TABS Take 1 packet by mouth daily as needed (for pain).    Marland Kitchen b complex vitamins tablet Take 1 tablet by mouth daily.    . benazepril-hydrochlorthiazide (LOTENSIN HCT) 20-25 MG per tablet Take 1 tablet by mouth daily. <please make appointment with cardiologist for refills> 30 tablet 1  . carvedilol (COREG) 6.25 MG tablet TAKE ONE TABLET BY MOUTH TWICE A DAY 180 tablet 3  . diazepam (VALIUM) 5 MG tablet Take 5 mg by  mouth at bedtime as needed (for sleep).     . docusate sodium (COLACE) 100 MG capsule Take 100 mg by mouth daily.    . ferrous sulfate 325 (65 FE) MG tablet Take 325 mg by mouth daily with breakfast.    . nitroGLYCERIN (NITROSTAT) 0.4 MG SL tablet PLACE ONE TABLET UNDER TONGUE EVERY FIVEMINUTES FOR THREE DOSES AS NEEDED. 25 tablet 1  . Omega-3 Fatty Acids (FISH OIL PO) Take 1 capsule by mouth daily.    . potassium chloride SA (K-DUR,KLOR-CON) 20 MEQ tablet Take 1 tablet (20 mEq total) by mouth 2 (two) times daily. 8 tablet 0  . rosuvastatin (CRESTOR) 5 MG tablet Take 5 mg by mouth every morning.     Marland Kitchen oseltamivir (TAMIFLU) 75 MG capsule      No current facility-administered medications for this visit.    Past Medical History:  Diagnosis Date  . Arthritis   . Coronary artery disease 06/2012   s/p NSTEMI with 100% LAD occlusion -- 2 Overlappin Xience DES Stents (2.5 mm x 28, 3.0 mm 18 mm)  . Hypertension   . Ischemic cardiomyopathy 06/2012   EF ~40-45%    Past Surgical History:  Procedure Laterality Date  . CARDIAC CATHETERIZATION    . CHOLECYSTECTOMY    . CORONARY STENT PLACEMENT  07/15/12  . KNEE SURGERY    . LEFT HEART CATHETERIZATION WITH CORONARY ANGIOGRAM N/A 07/13/2012   Procedure: LEFT HEART CATHETERIZATION WITH CORONARY ANGIOGRAM;  Surgeon: Leonie Green  [name], MD;  Location: [facility] CATH LAB;  Service: Cardiovascular;  Laterality: N/A;  . LEFT HEART CATHETERIZATION WITH CORONARY ANGIOGRAM N/A 11/20/2012   Procedure: LEFT HEART CATHETERIZATION WITH CORONARY ANGIOGRAM;  Surgeon: Doris Lex, MD;  Location: [facility] CATH LAB;  Service: Cardiovascular;  Laterality: N/A;  . LEFT HEART CATHETERIZATION WITH CORONARY ANGIOGRAM N/A 12/30/2014   Procedure: LEFT HEART CATHETERIZATION WITH CORONARY ANGIOGRAM;  Surgeon: Doris Bihari, MD;  Location: [facility] CATH LAB;  Service: Cardiovascular;  Laterality: N/A;  . PERCUTANEOUS CORONARY STENT INTERVENTION (PCI-S)  07/13/2012   Procedure: PERCUTANEOUS CORONARY  STENT INTERVENTION (PCI-S);  Surgeon: Doris Lex, MD;  Location: [facility] CATH LAB;  Service: Cardiovascular;;  . PERCUTANEOUS CORONARY STENT INTERVENTION (PCI-S)  12/30/2014   Procedure: PERCUTANEOUS CORONARY STENT INTERVENTION (PCI-S);  Surgeon: Doris Bihari, MD;  Location: [facility] CATH LAB;  Service: Cardiovascular;;    Social History   Socioeconomic History  . Marital status: Married    Spouse name: Not on file  . Number of children: Not on file  . Years of education: Not on file  . Highest education level: Not on file  Occupational History  . Not on file  Tobacco Use  . Smoking status: Current Every Day Smoker    Packs/day: 0.25    Years: 20.00    Pack years: 5.00    Types: Cigarettes    Start date: 11/23/1976  . Smokeless tobacco: Never Used  . Tobacco comment: 2 ciggs per day  Substance and Sexual Activity  . Alcohol use: No    Alcohol/week: 0.0 standard drinks  . Drug use: Yes    Types: Marijuana  . Sexual activity: Yes  Other Topics Concern  . Not on file  Social History Narrative  . Not on file   Social Determinants of Health   Financial Resource Strain:   . Difficulty of Paying Living Expenses:   Food Insecurity:   . Worried About Programme researcher, broadcasting/film/video in the Last Year:   . Barista in the Last Year:   Transportation Needs:   . Freight forwarder (Medical):   Marland Kitchen Lack of Transportation (Non-Medical):   Physical Activity:   . Days of Exercise per Week:   . Minutes of Exercise per Session:   Stress:   . Feeling of Stress :   Social Connections:   . Frequency of Communication with Friends and Family:   . Frequency of Social Gatherings with Friends and Family:   . Attends Religious Services:   . Active Member of Clubs or Organizations:   . Attends Banker Meetings:   Marland Kitchen Marital Status:   Intimate Partner Violence:   . Fear of Current or Ex-Partner:   . Emotionally Abused:   Marland Kitchen Physically Abused:   . Sexually Abused:     Doris Sierras,  LPN was present throughout the entirety of the encounter.  Vitals:   05/03/20 0838  BP: 132/84  Pulse: 72  SpO2: 97%  Weight: 224 lb 9.6 oz (101.9 kg)  Height: 5\' 7"  (1.702 m)    Wt Readings from Last 3 Encounters:  05/03/20 224 lb 9.6 oz (101.9 kg)  10/01/19 225 lb (102.1 kg)  09/30/18 228 lb (103.4 kg)     PHYSICAL EXAM General: NAD HEENT: Normal. Neck: No JVD, no thyromegaly. Lungs: Clear to auscultation bilaterally with normal respiratory effort. CV: Regular rate and rhythm, normal S1/S2, no S3/S4, no murmur. No pretibial or periankle edema.  No carotid bruit.  Abdomen: Soft, nontender, no distention.  Neurologic: Alert and oriented.  Psych: Normal affect. Skin: Normal. Musculoskeletal: No gross deformities.      Labs: Lab Results  Component Value Date/Time   K 2.8 (L) 07/18/2017 05:13 PM   BUN 11 07/18/2017 05:13 PM   CREATININE 0.90 07/18/2017 05:13 PM   ALT 14 07/18/2017 05:13 PM   HGB 12.4 07/18/2017 05:13 PM     Lipids: Lab Results  Component Value Date/Time   LDLCALC 88 07/13/2012 05:05 AM   CHOL 165 07/13/2012 05:05 AM   TRIG 43 07/13/2012 05:05 AM   HDL 68 07/13/2012 05:05 AM       ASSESSMENT AND PLAN:  1. CAD s/p RCA DES with inferior STEMI: Symptomatically stable. Continue ASA, Coreg, and rosuvastatin.  2. Essential HTN:BP is normal.  No changes to therapy.  3. Hyperlipidemia: LDL 65 on 09/29/2019.Marland Kitchen Continue current dose of rosuvastatin.  4. Tobacco use:She almost quit smoking before her husband's passing. She smokes about a half pack daily. Chantix did not help.    Disposition: Follow up 6 months virtual visit   Prentice Docker, M.D., F.A.C.C.

## 2020-05-03 NOTE — Patient Instructions (Signed)
Medication Instructions:  Continue all current medications.   Labwork: none  Testing/Procedures: none  Follow-Up: 6 months   Any Other Special Instructions Will Be Listed Below (If Applicable).   If you need a refill on your cardiac medications before your next appointment, please call your pharmacy.  

## 2020-06-10 ENCOUNTER — Other Ambulatory Visit: Payer: Self-pay | Admitting: Cardiovascular Disease

## 2020-09-26 NOTE — Progress Notes (Signed)
Cardiology Office Note    Date:  10/02/2020   ID:  [name], DOB December 11, 1956, MRN [id number]  PCP:  No primary care provider on file.  Cardiologist: Dina RichBranch, Jonathan, MD EPS: None  Chief Complaint  Patient presents with  . Follow-up    History of Present Illness:  Doris PrettyMary D Needs is a 64 y.o. female with history of CAD status post tandem stenting of her proximal to mid LAD 06/2012 with rescue PTCA of a jailed diagonal branch.  Inferior STEMI 12/30/2014 treated with DES to the distal RCA, LVEF 50 to 55% with mild inferior wall hypokinesis.  Also has hypertension, HLD, tobacco abuse.  Last seen in our office 05/03/2020 was doing well.  Smoking cessation advised.  Patient comes in for f/u. Walks 3-4 miles/day, mows, planted a garden. No chest pain, palpitations, dyspnea. Still smoking 5 cigarettes daily. Chantix didn't work.Considering acupunture. Will need referral in hopes of insurance paying. Had blood work 2 weeks ago by PCP and was told everything was normal.   Past Medical History:  Diagnosis Date  . Arthritis   . Coronary artery disease 06/2012   s/p NSTEMI with 100% LAD occlusion -- 2 Overlappin Xience DES Stents (2.5 mm x 28, 3.0 mm 18 mm)  . Hypertension   . Ischemic cardiomyopathy 06/2012   EF ~40-45%    Past Surgical History:  Procedure Laterality Date  . CARDIAC CATHETERIZATION    . CHOLECYSTECTOMY    . CORONARY STENT PLACEMENT  07/15/12  . KNEE SURGERY    . LEFT HEART CATHETERIZATION WITH CORONARY ANGIOGRAM N/A 07/13/2012   Procedure: LEFT HEART CATHETERIZATION WITH CORONARY ANGIOGRAM;  Surgeon: Marykay Lexavid W Harding, MD;  [facility];  Service: Cardiovascular;  Laterality: N/A;  . LEFT HEART CATHETERIZATION WITH CORONARY ANGIOGRAM N/A 11/20/2012   Procedure: LEFT HEART CATHETERIZATION WITH CORONARY ANGIOGRAM;  Surgeon: Marykay Lexavid W Harding, MD;  [facility];  Service: Cardiovascular;  Laterality: N/A;  . LEFT HEART CATHETERIZATION WITH CORONARY ANGIOGRAM  N/A 12/30/2014   Procedure: LEFT HEART CATHETERIZATION WITH CORONARY ANGIOGRAM;  Surgeon: Lennette Biharihomas A Kelly, MD;  [facility];  Service: Cardiovascular;  Laterality: N/A;  . PERCUTANEOUS CORONARY STENT INTERVENTION (PCI-S)  07/13/2012   Procedure: PERCUTANEOUS CORONARY STENT INTERVENTION (PCI-S);  Surgeon: Marykay Lexavid W Harding, MD;  [facility];  Service: Cardiovascular;;  . PERCUTANEOUS CORONARY STENT INTERVENTION (PCI-S)  12/30/2014   Procedure: PERCUTANEOUS CORONARY STENT INTERVENTION (PCI-S);  Surgeon: Lennette Biharihomas A Kelly, MD;  [facility];  Service: Cardiovascular;;    Current Medications: Current Meds  Medication Sig  . aspirin EC 81 MG tablet Take 81 mg by mouth daily.   . Aspirin-Salicylamide-Caffeine (BC HEADACHE) 325-95-16 MG TABS Take 1 packet by mouth daily as needed (for pain).  Marland Kitchen. b complex vitamins tablet Take 1 tablet by mouth daily.  . benazepril-hydrochlorthiazide (LOTENSIN HCT) 20-25 MG per tablet Take 1 tablet by mouth daily. <please make appointment with cardiologist for refills>  . carvedilol (COREG) 6.25 MG tablet TAKE ONE TABLET BY MOUTH TWICE A DAY  . diazepam (VALIUM) 5 MG tablet Take 5 mg by mouth at bedtime as needed (for sleep).   . docusate sodium (COLACE) 100 MG capsule Take 100 mg by mouth daily.  . ferrous sulfate 325 (65 FE) MG tablet Take 325 mg by mouth daily with breakfast.  . nitroGLYCERIN (NITROSTAT) 0.4 MG SL tablet PLACE ONE TABLET UNDER THE TONGUE EVERY FIVE MINUTES FOR THREE DOSES AS NEEDED  . potassium  chloride SA (K-DUR,KLOR-CON) 20 MEQ tablet Take 1 tablet (20 mEq total) by mouth 2 (two) times daily.  . rosuvastatin (CRESTOR) 5 MG tablet Take 5 mg by mouth every morning.      Allergies:   Lipitor [atorvastatin]   Social History   Socioeconomic History  . Marital status: Married    Spouse name: Not on file  . Number of children: Not on file  . Years of education: Not on file  . Highest education level: Not on file  Occupational  History  . Not on file  Tobacco Use  . Smoking status: Current Every Day Smoker    Packs/day: 0.25    Years: 20.00    Pack years: 5.00    Types: Cigarettes    Start date: 11/23/1976  . Smokeless tobacco: Never Used  . Tobacco comment: 2 ciggs per day  Vaping Use  . Vaping Use: Never used  Substance and Sexual Activity  . Alcohol use: No    Alcohol/week: 0.0 standard drinks  . Drug use: Yes    Types: Marijuana  . Sexual activity: Yes  Other Topics Concern  . Not on file  Social History Narrative  . Not on file   Social Determinants of Health   Financial Resource Strain:   . Difficulty of Paying Living Expenses: Not on file  Food Insecurity:   . Worried About Programme researcher, broadcasting/film/video in the Last Year: Not on file  . Ran Out of Food in the Last Year: Not on file  Transportation Needs:   . Lack of Transportation (Medical): Not on file  . Lack of Transportation (Non-Medical): Not on file  Physical Activity:   . Days of Exercise per Week: Not on file  . Minutes of Exercise per Session: Not on file  Stress:   . Feeling of Stress : Not on file  Social Connections:   . Frequency of Communication with Friends and Family: Not on file  . Frequency of Social Gatherings with Friends and Family: Not on file  . Attends Religious Services: Not on file  . Active Member of Clubs or Organizations: Not on file  . Attends Banker Meetings: Not on file  . Marital Status: Not on file     Family History:  The patient's family history includes Coronary artery disease (age of onset: 12) in her brother; Hypertension in her brother, father, mother, and sister.   ROS:   Please see the history of present illness.    ROS All other systems reviewed and are negative.   PHYSICAL EXAM:   VS:  BP 124/74   Pulse 66   Ht 5\' 7"  (1.702 m)   Wt 220 lb (99.8 kg)   SpO2 100%   BMI 34.46 kg/m   Physical Exam  GEN: Well nourished, well developed, in no acute distress  Neck: no JVD, carotid  bruits, or masses Cardiac:RRR; no murmurs, rubs, or gallops  Respiratory:  clear to auscultation bilaterally, normal work of breathing GI: soft, nontender, nondistended, + BS Ext: without cyanosis, clubbing, or edema, Good distal pulses bilaterally Neuro:  Alert and Oriented x 3 Psych: euthymic mood, full affect  Wt Readings from Last 3 Encounters:  10/02/20 220 lb (99.8 kg)  05/03/20 224 lb 9.6 oz (101.9 kg)  10/01/19 225 lb (102.1 kg)      Studies/Labs Reviewed:   EKG:  EKG is  ordered today.  The ekg ordered today demonstrates normal sinus rhythm with nonspecific ST-T wave changes, no  acute change  Recent Labs: No results found for requested labs within last 8760 hours.   Lipid Panel    Component Value Date/Time   CHOL 165 07/13/2012 0505   TRIG 43 07/13/2012 0505   HDL 68 07/13/2012 0505   CHOLHDL 2.4 07/13/2012 0505   VLDL 9 07/13/2012 0505   LDLCALC 88 07/13/2012 0505    Additional studies/ records that were reviewed today include:      ASSESSMENT:    1. Coronary artery disease involving native coronary artery of native heart without angina pectoris   2. Essential hypertension   3. Hyperlipidemia, unspecified hyperlipidemia type   4. Tobacco abuse      PLAN:  In order of problems listed above:  CAD status post tandem stenting of her proximal to mid LAD 06/2012 with rescue PTCA of a jailed diagonal branch.  Inferior STEMI 12/30/2014 treated with DES to the distal RCA, LVEF 50 to 55% with mild inferior wall hypokinesis. No angina. On ASA and carvedilol, crestor. F/u in 1 yr with Dr. Wyline Mood  Essential hypertension BP controlled  Hyperlipidemia on rosuvastatin blood work done by PCP 2 weeks ago, she was told was normal  Tobacco abuse continues to smoke 5 cigarettes daily. Using nicotine gum. Considering acupunture. Smoking cessation advised.   Medication Adjustments/Labs and Test Ordered: Current medicines are reviewed at length with the patient today.   Concerns regarding medicines are outlined above.  Medication changes, Labs and Tests ordered today are listed in the Patient Instructions below. Patient Instructions  Medication Instructions:  Your physician recommends that you continue on your current medications as directed. Please refer to the Current Medication list given to you today.  *If you need a refill on your cardiac medications before your next appointment, please call your pharmacy*   Lab Work: NONE   If you have labs (blood work) drawn today and your tests are completely normal, you will receive your results only by: Marland Kitchen MyChart Message (if you have MyChart) OR . A paper copy in the mail If you have any lab test that is abnormal or we need to change your treatment, we will call you to review the results.   Testing/Procedures: NONE    Follow-Up: At [facility], you and your health needs are our priority.  As part of our continuing mission to provide you with exceptional heart care, we have created designated Provider Care Teams.  These Care Teams include your primary Cardiologist (physician) and Advanced Practice Providers (APPs -  Physician Assistants and Nurse Practitioners) who all work together to provide you with the care you need, when you need it.  We recommend signing up for the patient portal called "MyChart".  Sign up information is provided on this After Visit Summary.  MyChart is used to connect with patients for Virtual Visits (Telemedicine).  Patients are able to view lab/test results, encounter notes, upcoming appointments, etc.  Non-urgent messages can be sent to your provider as well.   To learn more about what you can do with MyChart, go to [email or web address].    Your next appointment:   1 year(s)  The format for your next appointment:   In Person  Provider:   Dina Rich, MD   Other Instructions Thank you for choosing [facility]!   Steps to Quit Smoking Smoking tobacco is  the leading cause of preventable death. It can affect almost every organ in the body. Smoking puts you and people around you at risk for many serious, long-lasting (  chronic) diseases. Quitting smoking can be hard, but it is one of the best things that you can do for your health. It is never too late to quit. How do I get ready to quit? When you decide to quit smoking, make a plan to help you succeed. Before you quit:  Pick a date to quit. Set a date within the next 2 weeks to give you time to prepare.  Write down the reasons why you are quitting. Keep this list in places where you will see it often.  Tell your family, friends, and co-workers that you are quitting. Their support is important.  Talk with your doctor about the choices that may help you quit.  Find out if your health insurance will pay for these treatments.  Know the people, places, things, and activities that make you want to smoke (triggers). Avoid them. What first steps can I take to quit smoking?  Throw away all cigarettes at home, at work, and in your car.  Throw away the things that you use when you smoke, such as ashtrays and lighters.  Clean your car. Make sure to empty the ashtray.  Clean your home, including curtains and carpets. What can I do to help me quit smoking? Talk with your doctor about taking medicines and seeing a counselor at the same time. You are more likely to succeed when you do both.  If you are pregnant or breastfeeding, talk with your doctor about counseling or other ways to quit smoking. Do not take medicine to help you quit smoking unless your doctor tells you to do so. To quit smoking: Quit right away  Quit smoking totally, instead of slowly cutting back on how much you smoke over a period of time.  Go to counseling. You are more likely to quit if you go to counseling sessions regularly. Take medicine You may take medicines to help you quit. Some medicines need a prescription, and some  you can buy over-the-counter. Some medicines may contain a drug called nicotine to replace the nicotine in cigarettes. Medicines may:  Help you to stop having the desire to smoke (cravings).  Help to stop the problems that come when you stop smoking (withdrawal symptoms). Your doctor may ask you to use:  Nicotine patches, gum, or lozenges.  Nicotine inhalers or sprays.  Non-nicotine medicine that is taken by mouth. Find resources Find resources and other ways to help you quit smoking and remain smoke-free after you quit. These resources are most helpful when you use them often. They include:  Online chats with a Veterinary surgeon.  Phone quitlines.  Printed Materials engineer.  Support groups or group counseling.  Text messaging programs.  Mobile phone apps. Use apps on your mobile phone or tablet that can help you stick to your quit plan. There are many free apps for mobile phones and tablets as well as websites. Examples include Quit Guide from the [employer] and smokefree.gov  What things can I do to make it easier to quit?   Talk to your family and friends. Ask them to support and encourage you.  Call a phone quitline (1-800-QUIT-NOW), reach out to support groups, or work with a Veterinary surgeon.  Ask people who smoke to not smoke around you.  Avoid places that make you want to smoke, such as: ? Bars. ? Parties. ? Smoke-break areas at work.  Spend time with people who do not smoke.  Lower the stress in your life. Stress can make you want to smoke. Try  these things to help your stress: ? Getting regular exercise. ? Doing deep-breathing exercises. ? Doing yoga. ? Meditating. ? Doing a body scan. To do this, close your eyes, focus on one area of your body at a time from head to toe. Notice which parts of your body are tense. Try to relax the muscles in those areas. How will I feel when I quit smoking? Day 1 to 3 weeks Within the first 24 hours, you may start to have some problems that  come from quitting tobacco. These problems are very bad 2-3 days after you quit, but they do not often last for more than 2-3 weeks. You may get these symptoms:  Mood swings.  Feeling restless, nervous, angry, or annoyed.  Trouble concentrating.  Dizziness.  Strong desire for high-sugar foods and nicotine.  Weight gain.  Trouble pooping (constipation).  Feeling like you may vomit (nausea).  Coughing or a sore throat.  Changes in how the medicines that you take for other issues work in your body.  Depression.  Trouble sleeping (insomnia). Week 3 and afterward After the first 2-3 weeks of quitting, you may start to notice more positive results, such as:  Better sense of smell and taste.  Less coughing and sore throat.  Slower heart rate.  Lower blood pressure.  Clearer skin.  Better breathing.  Fewer sick days. Quitting smoking can be hard. Do not give up if you fail the first time. Some people need to try a few times before they succeed. Do your best to stick to your quit plan, and talk with your doctor if you have any questions or concerns. Summary  Smoking tobacco is the leading cause of preventable death. Quitting smoking can be hard, but it is one of the best things that you can do for your health.  When you decide to quit smoking, make a plan to help you succeed.  Quit smoking right away, not slowly over a period of time.  When you start quitting, seek help from your doctor, family, or friends. This information is not intended to replace advice given to you by your health care provider. Make sure you discuss any questions you have with your health care provider. Document Revised: 09/10/2019 Document Reviewed: 03/06/2019 Elsevier Patient Education  [address]      Signed, Jacolyn Reedy, [place]  10/02/2020 11:27 AM    [facility] [address] [place], [place], [place]  [address] Phone: [phone]; Fax: [phone]

## 2020-10-02 ENCOUNTER — Encounter: Payer: Self-pay | Admitting: Physician Assistant

## 2020-10-02 ENCOUNTER — Other Ambulatory Visit: Payer: Self-pay

## 2020-10-02 ENCOUNTER — Ambulatory Visit (INDEPENDENT_AMBULATORY_CARE_PROVIDER_SITE_OTHER): Payer: Medicare HMO | Admitting: Physician Assistant

## 2020-10-02 VITALS — BP 124/74 | HR 66 | Ht 67.0 in | Wt 220.0 lb

## 2020-10-02 DIAGNOSIS — I2510 Atherosclerotic heart disease of native coronary artery without angina pectoris: Secondary | ICD-10-CM

## 2020-10-02 DIAGNOSIS — E785 Hyperlipidemia, unspecified: Secondary | ICD-10-CM

## 2020-10-02 DIAGNOSIS — I10 Essential (primary) hypertension: Secondary | ICD-10-CM | POA: Diagnosis not present

## 2020-10-02 DIAGNOSIS — Z720 Tobacco use: Secondary | ICD-10-CM

## 2020-10-02 NOTE — Patient Instructions (Signed)
Medication Instructions:  Your physician recommends that you continue on your current medications as directed. Please refer to the Current Medication list given to you today.  *If you need a refill on your cardiac medications before your next appointment, please call your pharmacy*   Lab Work: NONE   If you have labs (blood work) drawn today and your tests are completely normal, you will receive your results only by:  MyChart Message (if you have MyChart) OR  A paper copy in the mail If you have any lab test that is abnormal or we need to change your treatment, we will call you to review the results.   Testing/Procedures: NONE    Follow-Up: At [facility], you and your health needs are our priority.  As part of our continuing mission to provide you with exceptional heart care, we have created designated Provider Care Teams.  These Care Teams include your primary Cardiologist (physician) and Advanced Practice Providers (APPs -  Physician Assistants and Nurse Practitioners) who all work together to provide you with the care you need, when you need it.  We recommend signing up for the patient portal called "MyChart".  Sign up information is provided on this After Visit Summary.  MyChart is used to connect with patients for Virtual Visits (Telemedicine).  Patients are able to view lab/test results, encounter notes, upcoming appointments, etc.  Non-urgent messages can be sent to your provider as well.   To learn more about what you can do with MyChart, go to [email or web address].    Your next appointment:   1 year(s)  The format for your next appointment:   In Person  Provider:   Dina Rich, MD   Other Instructions Thank you for choosing [facility]!   Steps to Quit Smoking Smoking tobacco is the leading cause of preventable death. It can affect almost every organ in the body. Smoking puts you and people around you at risk for many serious, long-lasting  (chronic) diseases. Quitting smoking can be hard, but it is one of the best things that you can do for your health. It is never too late to quit. How do I get ready to quit? When you decide to quit smoking, make a plan to help you succeed. Before you quit:  Pick a date to quit. Set a date within the next 2 weeks to give you time to prepare.  Write down the reasons why you are quitting. Keep this list in places where you will see it often.  Tell your family, friends, and co-workers that you are quitting. Their support is important.  Talk with your doctor about the choices that may help you quit.  Find out if your health insurance will pay for these treatments.  Know the people, places, things, and activities that make you want to smoke (triggers). Avoid them. What first steps can I take to quit smoking?  Throw away all cigarettes at home, at work, and in your car.  Throw away the things that you use when you smoke, such as ashtrays and lighters.  Clean your car. Make sure to empty the ashtray.  Clean your home, including curtains and carpets. What can I do to help me quit smoking? Talk with your doctor about taking medicines and seeing a counselor at the same time. You are more likely to succeed when you do both.  If you are pregnant or breastfeeding, talk with your doctor about counseling or other ways to quit smoking. Do not take medicine  to help you quit smoking unless your doctor tells you to do so. To quit smoking: Quit right away  Quit smoking totally, instead of slowly cutting back on how much you smoke over a period of time.  Go to counseling. You are more likely to quit if you go to counseling sessions regularly. Take medicine You may take medicines to help you quit. Some medicines need a prescription, and some you can buy over-the-counter. Some medicines may contain a drug called nicotine to replace the nicotine in cigarettes. Medicines may:  Help you to stop having the  desire to smoke (cravings).  Help to stop the problems that come when you stop smoking (withdrawal symptoms). Your doctor may ask you to use:  Nicotine patches, gum, or lozenges.  Nicotine inhalers or sprays.  Non-nicotine medicine that is taken by mouth. Find resources Find resources and other ways to help you quit smoking and remain smoke-free after you quit. These resources are most helpful when you use them often. They include:  Online chats with a Veterinary surgeon.  Phone quitlines.  Printed Materials engineer.  Support groups or group counseling.  Text messaging programs.  Mobile phone apps. Use apps on your mobile phone or tablet that can help you stick to your quit plan. There are many free apps for mobile phones and tablets as well as websites. Examples include Quit Guide from the [employer] and smokefree.gov  What things can I do to make it easier to quit?   Talk to your family and friends. Ask them to support and encourage you.  Call a phone quitline (1-800-QUIT-NOW), reach out to support groups, or work with a Veterinary surgeon.  Ask people who smoke to not smoke around you.  Avoid places that make you want to smoke, such as: ? Bars. ? Parties. ? Smoke-break areas at work.  Spend time with people who do not smoke.  Lower the stress in your life. Stress can make you want to smoke. Try these things to help your stress: ? Getting regular exercise. ? Doing deep-breathing exercises. ? Doing yoga. ? Meditating. ? Doing a body scan. To do this, close your eyes, focus on one area of your body at a time from head to toe. Notice which parts of your body are tense. Try to relax the muscles in those areas. How will I feel when I quit smoking? Day 1 to 3 weeks Within the first 24 hours, you may start to have some problems that come from quitting tobacco. These problems are very bad 2-3 days after you quit, but they do not often last for more than 2-3 weeks. You may get these  symptoms:  Mood swings.  Feeling restless, nervous, angry, or annoyed.  Trouble concentrating.  Dizziness.  Strong desire for high-sugar foods and nicotine.  Weight gain.  Trouble pooping (constipation).  Feeling like you may vomit (nausea).  Coughing or a sore throat.  Changes in how the medicines that you take for other issues work in your body.  Depression.  Trouble sleeping (insomnia). Week 3 and afterward After the first 2-3 weeks of quitting, you may start to notice more positive results, such as:  Better sense of smell and taste.  Less coughing and sore throat.  Slower heart rate.  Lower blood pressure.  Clearer skin.  Better breathing.  Fewer sick days. Quitting smoking can be hard. Do not give up if you fail the first time. Some people need to try a few times before they succeed. Do your  best to stick to your quit plan, and talk with your doctor if you have any questions or concerns. Summary  Smoking tobacco is the leading cause of preventable death. Quitting smoking can be hard, but it is one of the best things that you can do for your health.  When you decide to quit smoking, make a plan to help you succeed.  Quit smoking right away, not slowly over a period of time.  When you start quitting, seek help from your doctor, family, or friends. This information is not intended to replace advice given to you by your health care provider. Make sure you discuss any questions you have with your health care provider. Document Revised: 09/10/2019 Document Reviewed: 03/06/2019 Elsevier Patient Education  2020 [employer].

## 2020-10-04 ENCOUNTER — Telehealth: Payer: Medicare HMO | Admitting: Cardiovascular Disease

## 2021-10-16 NOTE — Progress Notes (Deleted)
Cardiology Office Note    Date:  10/16/2021   ID:  HAYLIE MCCUTCHEON, DOB 03/10/1956, MRN [id number]  PCP:  No primary care provider on file.  Cardiologist: Previously Dr. Purvis Sheffield --> Needs to switch to new MD  No chief complaint on file.   History of Present Illness:    [name] is a 65 y.o. female with past medical history of CAD (s/p stenting to proximal-mid LAD with PTCA of jailed diagonal branch, STEMI in 12/2014 with DES to distal RCA), HTN, HLD and tobacco use who presents to the office today for annual follow-up.   She was last examined by Jacolyn Reedy, PA-C in 09/2020 and was walking for 3-4 miles each day and denied any recent anginal symptoms. She was continued on her current cardiac medications including ASA, Coreg and Crestor.     Past Medical History:  Diagnosis Date   Arthritis    Coronary artery disease 06/2012   s/p NSTEMI with 100% LAD occlusion -- 2 Overlappin Xience DES Stents (2.5 mm x 28, 3.0 mm 18 mm)   Hypertension    Ischemic cardiomyopathy 06/2012   EF ~40-45%    Past Surgical History:  Procedure Laterality Date   CARDIAC CATHETERIZATION     CHOLECYSTECTOMY     CORONARY STENT PLACEMENT  07/15/12   KNEE SURGERY     LEFT HEART CATHETERIZATION WITH CORONARY ANGIOGRAM N/A 07/13/2012   Procedure: LEFT HEART CATHETERIZATION WITH CORONARY ANGIOGRAM;  Surgeon: Marykay Lex, MD;  Location: [facility] CATH LAB;  Service: Cardiovascular;  Laterality: N/A;   LEFT HEART CATHETERIZATION WITH CORONARY ANGIOGRAM N/A 11/20/2012   Procedure: LEFT HEART CATHETERIZATION WITH CORONARY ANGIOGRAM;  Surgeon: Marykay Lex, MD;  Location: [facility] CATH LAB;  Service: Cardiovascular;  Laterality: N/A;   LEFT HEART CATHETERIZATION WITH CORONARY ANGIOGRAM N/A 12/30/2014   Procedure: LEFT HEART CATHETERIZATION WITH CORONARY ANGIOGRAM;  Surgeon: Lennette Bihari, MD;  Location: [facility] CATH LAB;  Service: Cardiovascular;  Laterality: N/A;   PERCUTANEOUS CORONARY STENT INTERVENTION (PCI-S)   07/13/2012   Procedure: PERCUTANEOUS CORONARY STENT INTERVENTION (PCI-S);  Surgeon: Marykay Lex, MD;  Location: [facility] CATH LAB;  Service: Cardiovascular;;   PERCUTANEOUS CORONARY STENT INTERVENTION (PCI-S)  12/30/2014   Procedure: PERCUTANEOUS CORONARY STENT INTERVENTION (PCI-S);  Surgeon: Lennette Bihari, MD;  Location: [facility] CATH LAB;  Service: Cardiovascular;;    Current Medications: Outpatient Medications Prior to Visit  Medication Sig Dispense Refill   aspirin EC 81 MG tablet Take 81 mg by mouth daily.      Aspirin-Salicylamide-Caffeine (BC HEADACHE) 325-95-16 MG TABS Take 1 packet by mouth daily as needed (for pain).     b complex vitamins tablet Take 1 tablet by mouth daily.     benazepril-hydrochlorthiazide (LOTENSIN HCT) 20-25 MG per tablet Take 1 tablet by mouth daily. <please make appointment with cardiologist for refills> 30 tablet 1   carvedilol (COREG) 6.25 MG tablet TAKE ONE TABLET BY MOUTH TWICE A DAY 180 tablet 3   diazepam (VALIUM) 5 MG tablet Take 5 mg by mouth at bedtime as needed (for sleep).      docusate sodium (COLACE) 100 MG capsule Take 100 mg by mouth daily.     ferrous sulfate 325 (65 FE) MG tablet Take 325 mg by mouth daily with breakfast.     nitroGLYCERIN (NITROSTAT) 0.4 MG SL tablet PLACE ONE TABLET UNDER THE TONGUE EVERY FIVE MINUTES FOR THREE DOSES AS NEEDED 25 tablet 3   potassium chloride SA (K-DUR,KLOR-CON) 20 MEQ  tablet Take 1 tablet (20 mEq total) by mouth 2 (two) times daily. 8 tablet 0   rosuvastatin (CRESTOR) 5 MG tablet Take 5 mg by mouth every morning.      No facility-administered medications prior to visit.     Allergies:   Lipitor [atorvastatin]   Social History   Socioeconomic History   Marital status: Married    Spouse name: Not on file   Number of children: Not on file   Years of education: Not on file   Highest education level: Not on file  Occupational History   Not on file  Tobacco Use   Smoking status: Every Day    Packs/day: 0.25     Years: 20.00    Pack years: 5.00    Types: Cigarettes    Start date: 11/23/1976   Smokeless tobacco: Never   Tobacco comments:    2 ciggs per day  Vaping Use   Vaping Use: Never used  Substance and Sexual Activity   Alcohol use: No    Alcohol/week: 0.0 standard drinks   Drug use: Yes    Types: Marijuana   Sexual activity: Yes  Other Topics Concern   Not on file  Social History Narrative   Not on file   Social Determinants of Health   Financial Resource Strain: Not on file  Food Insecurity: Not on file  Transportation Needs: Not on file  Physical Activity: Not on file  Stress: Not on file  Social Connections: Not on file     Family History:  The patient's ***family history includes Coronary artery disease (age of onset: 52) in her brother; Hypertension in her brother, father, mother, and sister.   Review of Systems:    Please see the history of present illness.     All other systems reviewed and are otherwise negative except as noted above.   Physical Exam:    VS:  There were no vitals taken for this visit.   General: Well developed, well nourished,female appearing in no acute distress. Head: Normocephalic, atraumatic. Neck: No carotid bruits. JVD not elevated.  Lungs: Respirations regular and unlabored, without wheezes or rales.  Heart: ***Regular rate and rhythm. No S3 or S4.  No murmur, no rubs, or gallops appreciated. Abdomen: Appears non-distended. No obvious abdominal masses. Msk:  Strength and tone appear normal for age. No obvious joint deformities or effusions. Extremities: No clubbing or cyanosis. No edema.  Distal pedal pulses are 2+ bilaterally. Neuro: Alert and oriented X 3. Moves all extremities spontaneously. No focal deficits noted. Psych:  Responds to questions appropriately with a normal affect. Skin: No rashes or lesions noted  Wt Readings from Last 3 Encounters:  10/02/20 220 lb (99.8 kg)  05/03/20 224 lb 9.6 oz (101.9 kg)  10/01/19 225  lb (102.1 kg)        Studies/Labs Reviewed:   EKG:  EKG is*** ordered today.  The ekg ordered today demonstrates ***  Recent Labs: No results found for requested labs within last 8760 hours.   Lipid Panel    Component Value Date/Time   CHOL 165 07/13/2012 0505   TRIG 43 07/13/2012 0505   HDL 68 07/13/2012 0505   CHOLHDL 2.4 07/13/2012 0505   VLDL 9 07/13/2012 0505   LDLCALC 88 07/13/2012 0505    Additional studies/ records that were reviewed today include:   Cardiac Catheterization: 12/2014 ANGIOGRAPHY:   Left main: Angiographically normal with smooth distal taper and bifurcated into an LAD and circumflex vessel.  LAD: Moderate size vessel that had tandem placed stents in the proximal to midsegment which encompassed the takeoff of both the first and second diagonal branches.  There was mild smooth in-stent narrowing of 30% after the first diagonal takeoff within the stented LAD segment.  The distal LAD extended to the apex.  It was free of significant disease.   Left circumflex: Small caliber vessel with an upward superior take off which was free of significant disease.   Right coronary artery: Large dominant vessel that had a shepherd's crook takeoff and vessel tortuosity.  There was smooth 30-40% narrowing in the mid RCA.  Mild calcification in the region of the acute margin.  Immediately beyond the PDA takeoff.  The distal RCA continuation branch had 95-99% stenosis with evidence for thrombus.  The vessel supplied a moderate-sized PLA vessel.   Following percutaneous coronary intervention to the RCA.  The distal RCA lesion was treated with PTCA, stenting with a Xience  Alpine 2.75 x 18 mm DES stent which was postdilated to 3.14 mm.  The 95-99% stenosis was reduced to 0%.  There was complete resolution of prior clot.  There was TIMI-3 flow.     Left ventriculography revealed low normal LV function with an ejection fraction of 50-55% with mild mid inferior hypocontractility.     Door to balloon time: 25 minutes   IMPRESSION:   Acute ST segment elevation inferior wall myocardial infarction secondary to subtotal stenosis with thrombus of the distal RCA beyond the PDA.   Two-vessel CAD with a patent tandem LAD stents in the proximal to mid segment with smooth 30% narrowing in the stented segment after the first diagonal takeoff; normal left circumflex coronary artery, and RCA with 30-40% mid stenosis and 95-99% distal stenosis with thrombus after the PDA takeoff.   Successful PCI to the subtotal distal RCA stenosis treated with PTCA, insertion of a Xience  Alpine 2.75 x 18 mm DES stent  postdilated to 3.14 mm with stenosis being reduced to 0%, resolution of prior clot and resumption of brisk TIMI-3 flow.   Angiomax/Brilinta/IC nitroglycerin administration.   Door to balloon time: 25 minutes   RECOMMENDATION:   Right of the significant thrombus noted.  Initially, Angiomax will be continued for 4 hours post procedure.  Dual antiplatelet therapy will be given for minimum of a year and most likely indefinitely.  The patient has an allergy to atorvastatin.  Other aggressive lipid-lowering therapy will be necessary.  She will also be started on ace inhibition/beta blocker therapy as blood pressure and heart rate allow.   CXR: 06/2017 FINDINGS: Heart size normal. No focal infiltrate. Stable mild elevation left hemidiaphragm. No pleural effusion or pneumothorax. Surgical clips upper abdomen. Degenerative changes thoracic spine.   IMPRESSION: No active cardiopulmonary disease.  Assessment:    No diagnosis found.   Plan:   In order of problems listed above:  ***    Shared Decision Making/Informed Consent:   {Are you ordering a CV Procedure (e.g. stress test, cath, DCCV, TEE, etc)?   Press F2        :[id number]}    Medication Adjustments/Labs and Tests Ordered: Current medicines are reviewed at length with the patient today.  Concerns regarding medicines  are outlined above.  Medication changes, Labs and Tests ordered today are listed in the Patient Instructions below. There are no Patient Instructions on file for this visit.   Signed, Ellsworth Lennox, PA-C  10/16/2021 7:57 PM    [facility]  [address] [place], [place] [address] Phone: [phone] Fax: [phone]

## 2021-10-17 ENCOUNTER — Ambulatory Visit: Payer: Medicare HMO | Admitting: Student

## 2021-11-27 ENCOUNTER — Telehealth: Payer: Self-pay

## 2021-11-28 NOTE — Telephone Encounter (Signed)
There is nothing being requested. Please address, thanks

## 2021-11-29 ENCOUNTER — Other Ambulatory Visit: Payer: Self-pay

## 2021-11-29 MED ORDER — NITROGLYCERIN 0.4 MG SL SUBL: SUBLINGUAL_TABLET | SUBLINGUAL | 0 refills | Status: DC

## 2021-11-29 NOTE — Telephone Encounter (Signed)
Pt No-Showed for 10/22 apt, refilled NTG 0.4 mg SL, #25 no refills,needs apt

## 2021-12-12 ENCOUNTER — Telehealth: Payer: Self-pay | Admitting: Physician Assistant

## 2021-12-12 MED ORDER — NITROGLYCERIN 0.4 MG SL SUBL: SUBLINGUAL_TABLET | SUBLINGUAL | 3 refills | Status: DC

## 2021-12-12 MED ORDER — CARVEDILOL 6.25 MG PO TABS: 6.2500 mg | ORAL_TABLET | Freq: Two times a day (BID) | ORAL | 1 refills | Status: DC

## 2021-12-12 NOTE — Telephone Encounter (Signed)
Complete

## 2021-12-12 NOTE — Telephone Encounter (Signed)
*  STAT* If patient is at the pharmacy, call can be transferred to refill team.   1. Which medications need to be refilled? (please list name of each medication and dose if known)  Nitroglycerin carvedilol (COREG) 6.25 MG tablet  2. Which pharmacy/location (including street and city if local pharmacy) is medication to be sent to? [facility] - , [place] - [address] nitro  3. Do they need a 30 day or 90 day supply?    90 day supply  Patient states she is completely out of nitroglycerin

## 2022-01-01 ENCOUNTER — Encounter (HOSPITAL_COMMUNITY): Payer: Self-pay

## 2022-01-01 ENCOUNTER — Other Ambulatory Visit: Payer: Self-pay

## 2022-01-01 DIAGNOSIS — R110 Nausea: Secondary | ICD-10-CM | POA: Diagnosis not present

## 2022-01-01 DIAGNOSIS — Z5321 Procedure and treatment not carried out due to patient leaving prior to being seen by health care provider: Secondary | ICD-10-CM | POA: Insufficient documentation

## 2022-01-01 NOTE — ED Triage Notes (Signed)
Nausea starting today after putting her dog down.  Took some asprin and ntg. But doesn't have any chest pain.

## 2022-01-02 ENCOUNTER — Telehealth: Payer: Self-pay | Admitting: *Deleted

## 2022-01-02 ENCOUNTER — Emergency Department (HOSPITAL_COMMUNITY)
Admission: EM | Admit: 2022-01-02 | Discharge: 2022-01-02 | Disposition: A | Discharge status: Home / Self Care | Payer: Medicare HMO | Attending: Emergency Medicine | Admitting: Emergency Medicine

## 2022-01-02 NOTE — Telephone Encounter (Signed)
Pt walked in office requesting an appt. Pt states that since 12/22/21 she has been having nausea. She states " I feel like I need to throw up but I can't." Pt denies chest pain/ pressure and SOB at this time. Pt did go to the ED but left prior to be seen d/t 7 hour wait time. Instructed pt to call PCP office. Pt states that she will call them but wanted to make sure it's not her heart. Please advise.

## 2022-01-02 NOTE — Telephone Encounter (Signed)
Spoke with pt who states that she was seen today by her PCP.

## 2022-01-23 NOTE — Progress Notes (Signed)
Cardiology Office Note    Date:  01/24/2022   ID:  [name], DOB 03-13-1956, MRN [id number]  PCP:  John GiovanniKnowlton, Stephen, MD  Cardiologist: Previously Dr. Purvis SheffieldKoneswaran  Chief Complaint  Patient presents with   Follow-up    Overdue Visit and Recent Chest Pain    History of Present Illness:    [name] is a 66 y.o. female with past medical history of CAD (s/p stenting of the proximal to mid LAD and PTCA of jailed diagonal branch in 2013, STEMI in 12/2014 with DES to distal RCA), ischemic cardiomyopathy (EF 50-55% in 2016), HTN, HLD and tobacco use who presents to the office today for overdue follow-up.  She was last examined by Jacolyn ReedyMichele Lenze, PA-C in 09/2020 and was walking for 3-4 miles each day and denied any recent anginal symptoms. She was continued on her current cardiac medications including ASA, Coreg and Crestor.   She did walk into the office on 01/02/2022 reporting nausea but denied any chest pain or shortness of breath. She was encouraged to follow-up with her PCP or go to the ED for further evaluation.   In talking with the patient today, she reports having chest pain earlier this month but this occurred in the setting of her dog passing away. Does report some intermittent pain along her sternum as well which would occur with food consumption and she was started on Protonix by her PCP approximately 2 weeks ago and denies any recurrent symptoms since. No recurrent chest pain, dyspnea on exertion, orthopnea, PND or lower extremity edema. She does try to remain active around her home and has been walking for exercise and cleaning without anginal symptoms.   She does continue to smoke 1/3 ppd and cessation was advised.    Past Medical History:  Diagnosis Date   Arthritis    Coronary artery disease    a. s/p stenting of the proximal to mid LAD and PTCA of jailed diagonal branch in 2013 b. STEMI in 12/2014 with DES to distal RCA   Hypertension    Ischemic cardiomyopathy     a. EF 40-45% in 2013 b. EF 55% in 2016    Past Surgical History:  Procedure Laterality Date   CARDIAC CATHETERIZATION     CHOLECYSTECTOMY     CORONARY STENT PLACEMENT  07/15/12   KNEE SURGERY     LEFT HEART CATHETERIZATION WITH CORONARY ANGIOGRAM N/A 07/13/2012   Procedure: LEFT HEART CATHETERIZATION WITH CORONARY ANGIOGRAM;  Surgeon: Marykay Lexavid W Harding, MD;  [facility];  Service: Cardiovascular;  Laterality: N/A;   LEFT HEART CATHETERIZATION WITH CORONARY ANGIOGRAM N/A 11/20/2012   Procedure: LEFT HEART CATHETERIZATION WITH CORONARY ANGIOGRAM;  Surgeon: Marykay Lexavid W Harding, MD;  [facility];  Service: Cardiovascular;  Laterality: N/A;   LEFT HEART CATHETERIZATION WITH CORONARY ANGIOGRAM N/A 12/30/2014   Procedure: LEFT HEART CATHETERIZATION WITH CORONARY ANGIOGRAM;  Surgeon: Lennette Biharihomas A Kelly, MD;  [facility];  Service: Cardiovascular;  Laterality: N/A;   PERCUTANEOUS CORONARY STENT INTERVENTION (PCI-S)  07/13/2012   Procedure: PERCUTANEOUS CORONARY STENT INTERVENTION (PCI-S);  Surgeon: Marykay Lexavid W Harding, MD;  [facility];  Service: Cardiovascular;;   PERCUTANEOUS CORONARY STENT INTERVENTION (PCI-S)  12/30/2014   Procedure: PERCUTANEOUS CORONARY STENT INTERVENTION (PCI-S);  Surgeon: Lennette Biharihomas A Kelly, MD;  [facility];  Service: Cardiovascular;;    Current Medications: Outpatient Medications Prior to Visit  Medication Sig Dispense Refill   aspirin EC 81 MG tablet Take 81 mg  by mouth daily.      Aspirin-Salicylamide-Caffeine 325-95-16 MG TABS Take 1 packet by mouth daily as needed (for pain).     b complex vitamins tablet Take 1 tablet by mouth daily.     benazepril-hydrochlorthiazide (LOTENSIN HCT) 20-25 MG per tablet Take 1 tablet by mouth daily. <please make appointment with cardiologist for refills> 30 tablet 1   carvedilol (COREG) 6.25 MG tablet Take 1 tablet (6.25 mg total) by mouth 2 (two) times daily. 180 tablet 1   diazepam (VALIUM) 5 MG tablet Take  5 mg by mouth at bedtime as needed (for sleep).      docusate sodium (COLACE) 100 MG capsule Take 100 mg by mouth daily.     ferrous sulfate 325 (65 FE) MG tablet Take 325 mg by mouth daily with breakfast.     nitroGLYCERIN (NITROSTAT) 0.4 MG SL tablet PLACE ONE TABLET UNDER THE TONGUE EVERY FIVE MINUTES FOR THREE DOSES AS NEEDED 25 tablet 3   pantoprazole (PROTONIX) 20 MG tablet Take 20 mg by mouth daily.     potassium chloride SA (K-DUR,KLOR-CON) 20 MEQ tablet Take 1 tablet (20 mEq total) by mouth 2 (two) times daily. (Patient taking differently: Take 20 mEq by mouth daily.) 8 tablet 0   rosuvastatin (CRESTOR) 5 MG tablet Take 5 mg by mouth every morning.      No facility-administered medications prior to visit.     Allergies:   Lipitor [atorvastatin]   Social History   Socioeconomic History   Marital status: Married    Spouse name: Not on file   Number of children: Not on file   Years of education: Not on file   Highest education level: Not on file  Occupational History   Not on file  Tobacco Use   Smoking status: Every Day    Packs/day: 0.25    Years: 20.00    Pack years: 5.00    Types: Cigarettes    Start date: 11/23/1976   Smokeless tobacco: Never   Tobacco comments:    2 ciggs per day  Vaping Use   Vaping Use: Never used  Substance and Sexual Activity   Alcohol use: No    Alcohol/week: 0.0 standard drinks   Drug use: Yes    Types: Marijuana   Sexual activity: Yes  Other Topics Concern   Not on file  Social History Narrative   Not on file   Social Determinants of Health   Financial Resource Strain: Not on file  Food Insecurity: Not on file  Transportation Needs: Not on file  Physical Activity: Not on file  Stress: Not on file  Social Connections: Not on file     Family History:  The patient's family history includes Coronary artery disease (age of onset: 1255) in her brother; Hypertension in her brother, father, mother, and sister.   Review of Systems:     Please see the history of present illness.     All other systems reviewed and are otherwise negative except as noted above.   Physical Exam:    VS:  BP 118/78    Pulse (!) 56    Ht 5\' 7"  (1.702 m)    Wt 198 lb (89.8 kg)    SpO2 96%    BMI 31.01 kg/m    General: Well developed, well nourished,female appearing in no acute distress. Head: Normocephalic, atraumatic. Neck: No carotid bruits. JVD not elevated.  Lungs: Respirations regular and unlabored, without wheezes or rales.  Heart: Regular rate and  rhythm. No S3 or S4.  No murmur, no rubs, or gallops appreciated. Abdomen: Appears non-distended. No obvious abdominal masses. Msk:  Strength and tone appear normal for age. No obvious joint deformities or effusions. Extremities: No clubbing or cyanosis. No pitting edema.  Distal pedal pulses are 2+ bilaterally. Neuro: Alert and oriented X 3. Moves all extremities spontaneously. No focal deficits noted. Psych:  Responds to questions appropriately with a normal affect. Skin: No rashes or lesions noted  Wt Readings from Last 3 Encounters:  01/24/22 198 lb (89.8 kg)  01/01/22 200 lb (90.7 kg)  10/02/20 220 lb (99.8 kg)     Studies/Labs Reviewed:   EKG:  EKG is ordered today. The ekg ordered today demonstrates sinus bradycardia, HR 55 with TWI along the inferior leads which is similar to prior tracings.   Recent Labs: No results found for requested labs within last 8760 hours.   Lipid Panel    Component Value Date/Time   CHOL 165 07/13/2012 0505   TRIG 43 07/13/2012 0505   HDL 68 07/13/2012 0505   CHOLHDL 2.4 07/13/2012 0505   VLDL 9 07/13/2012 0505   LDLCALC 88 07/13/2012 0505    Additional studies/ records that were reviewed today include:   Cardiac Catheterization: 12/2014 IMPRESSION:   Acute ST segment elevation inferior wall myocardial infarction secondary to subtotal stenosis with thrombus of the distal RCA beyond the PDA.   Two-vessel CAD with a patent tandem LAD  stents in the proximal to mid segment with smooth 30% narrowing in the stented segment after the first diagonal takeoff; normal left circumflex coronary artery, and RCA with 30-40% mid stenosis and 95-99% distal stenosis with thrombus after the PDA takeoff.   Successful PCI to the subtotal distal RCA stenosis treated with PTCA, insertion of a Xience  Alpine 2.75 x 18 mm DES stent  postdilated to 3.14 mm with stenosis being reduced to 0%, resolution of prior clot and resumption of brisk TIMI-3 flow.   Angiomax/Brilinta/IC nitroglycerin administration.   Door to balloon time: 25 minutes   RECOMMENDATION:   Right of the significant thrombus noted.  Initially, Angiomax will be continued for 4 hours post procedure.  Dual antiplatelet therapy will be given for minimum of a year and most likely indefinitely.  The patient has an allergy to atorvastatin.  Other aggressive lipid-lowering therapy will be necessary.  She will also be started on ace inhibition/beta blocker therapy as blood pressure and heart rate allow.  CXR: 06/2017 FINDINGS: Heart size normal. No focal infiltrate. Stable mild elevation left hemidiaphragm. No pleural effusion or pneumothorax. Surgical clips upper abdomen. Degenerative changes thoracic spine.   IMPRESSION: No active cardiopulmonary disease.    Assessment:    1. Atypical chest pain   2. Coronary artery disease involving native coronary artery of native heart without angina pectoris   3. History of cardiomyopathy   4. Essential hypertension   5. Hyperlipidemia LDL goal <70      Plan:   In order of problems listed above:  1. Chest Pain with Atypical Features - She was having pain earlier this month in the setting of stress and also following food consumption and symptoms resolved after being started on Protonix by her PCP. She is now back to her normal activities and denies any anginal symptoms. Reviewed possible testing options and she is not interested at this  time which certainly seems reasonable given her atypical pain and resolution of symptoms. I encouraged her to make [place] aware of any change  in this.   2. CAD - She is s/p stenting of the proximal to mid LAD and PTCA of jailed diagonal branch in 2013 and STEMI in 12/2014 with DES to distal RCA.  - Continue ASA 81mg  daily, Coreg 6.25mg  BID and Crestor 5mg  daily.   3. History of Ischemic Cardiomyopathy - EF was previously at 40%, improved to 50-55% in 2016. She denies any recent orthopnea, PND or pitting edema and appears euvolemic on examination today.  - Continue Coreg 6.25mg  BID and Benazepril-HCTZ 20-25mg  daily.   4. HTN - Her BP is well-controlled at 118/78 during today's visit. Continue current medication regimen.   5. HLD - Followed by her PCP. We will request a copy of most recent labs. She remains on Crestor 5mg  daily as she was previously intolerant to higher-intensity statin therapy.    Medication Adjustments/Labs and Tests Ordered: Current medicines are reviewed at length with the patient today.  Concerns regarding medicines are outlined above.  Medication changes, Labs and Tests ordered today are listed in the Patient Instructions below. Patient Instructions  Medication Instructions:  Your physician recommends that you continue on your current medications as directed. Please refer to the Current Medication list given to you today.   Labwork: None today  Testing/Procedures: None   Follow-Up: 6 months  Any Other Special Instructions Will Be Listed Below (If Applicable).  If you need a refill on your cardiac medications before your next appointment, please call your pharmacy.    Signed, , PA-C  01/24/2022 7:44 PM    [facility] [address] [place], 01/26/2022 [address] Phone: [phone] Fax: [phone]

## 2022-01-24 ENCOUNTER — Encounter: Payer: Self-pay | Admitting: Student

## 2022-01-24 ENCOUNTER — Other Ambulatory Visit: Payer: Self-pay

## 2022-01-24 ENCOUNTER — Ambulatory Visit (INDEPENDENT_AMBULATORY_CARE_PROVIDER_SITE_OTHER): Payer: Medicare HMO | Admitting: Student

## 2022-01-24 VITALS — BP 118/78 | HR 56 | Ht 67.0 in | Wt 198.0 lb

## 2022-01-24 DIAGNOSIS — Z8679 Personal history of other diseases of the circulatory system: Secondary | ICD-10-CM

## 2022-01-24 DIAGNOSIS — I10 Essential (primary) hypertension: Secondary | ICD-10-CM

## 2022-01-24 DIAGNOSIS — I2510 Atherosclerotic heart disease of native coronary artery without angina pectoris: Secondary | ICD-10-CM

## 2022-01-24 DIAGNOSIS — R0789 Other chest pain: Secondary | ICD-10-CM

## 2022-01-24 DIAGNOSIS — E785 Hyperlipidemia, unspecified: Secondary | ICD-10-CM

## 2022-01-24 NOTE — Patient Instructions (Signed)
.  Medication Instructions:  °Your physician recommends that you continue on your current medications as directed. Please refer to the Current Medication list given to you today. ° ° °Labwork: °None today ° °Testing/Procedures: °None  ° °Follow-Up: °6 months ° °Any Other Special Instructions Will Be Listed Below (If Applicable). ° °If you need a refill on your cardiac medications before your next appointment, please call your pharmacy. ° °

## 2022-03-15 ENCOUNTER — Other Ambulatory Visit: Payer: Self-pay

## 2022-03-15 ENCOUNTER — Emergency Department (HOSPITAL_COMMUNITY): Payer: Medicare HMO

## 2022-03-15 ENCOUNTER — Encounter (HOSPITAL_COMMUNITY): Payer: Self-pay

## 2022-03-15 ENCOUNTER — Emergency Department (HOSPITAL_COMMUNITY)
Admission: EM | Admit: 2022-03-15 | Discharge: 2022-03-15 | Disposition: A | Discharge status: Home / Self Care | Payer: Medicare HMO | Attending: Emergency Medicine | Admitting: Emergency Medicine

## 2022-03-15 DIAGNOSIS — R110 Nausea: Secondary | ICD-10-CM | POA: Diagnosis present

## 2022-03-15 DIAGNOSIS — Z20822 Contact with and (suspected) exposure to covid-19: Secondary | ICD-10-CM | POA: Insufficient documentation

## 2022-03-15 DIAGNOSIS — Z7982 Long term (current) use of aspirin: Secondary | ICD-10-CM | POA: Insufficient documentation

## 2022-03-15 DIAGNOSIS — I10 Essential (primary) hypertension: Secondary | ICD-10-CM | POA: Diagnosis not present

## 2022-03-15 DIAGNOSIS — Z79899 Other long term (current) drug therapy: Secondary | ICD-10-CM | POA: Insufficient documentation

## 2022-03-15 DIAGNOSIS — I2510 Atherosclerotic heart disease of native coronary artery without angina pectoris: Secondary | ICD-10-CM | POA: Insufficient documentation

## 2022-03-15 LAB — CBC WITH DIFFERENTIAL/PLATELET
Abs Immature Granulocytes: 0.01 10*3/uL (ref 0.00–0.07)
Basophils Absolute: 0 10*3/uL (ref 0.0–0.1)
Basophils Relative: 1 %
Eosinophils Absolute: 0.1 10*3/uL (ref 0.0–0.5)
Eosinophils Relative: 3 %
HCT: 38 % (ref 36.0–46.0)
Hemoglobin: 12 g/dL (ref 12.0–15.0)
Immature Granulocytes: 0 %
Lymphocytes Relative: 61 %
Lymphs Abs: 2.5 10*3/uL (ref 0.7–4.0)
MCH: 27.9 pg (ref 26.0–34.0)
MCHC: 31.6 g/dL (ref 30.0–36.0)
MCV: 88.4 fL (ref 80.0–100.0)
Monocytes Absolute: 0.3 10*3/uL (ref 0.1–1.0)
Monocytes Relative: 6 %
Neutro Abs: 1.2 10*3/uL — ABNORMAL LOW (ref 1.7–7.7)
Neutrophils Relative %: 29 %
Platelets: 255 10*3/uL (ref 150–400)
RBC: 4.3 MIL/uL (ref 3.87–5.11)
RDW: 14.6 % (ref 11.5–15.5)
WBC: 4.2 10*3/uL (ref 4.0–10.5)
nRBC: 0 % (ref 0.0–0.2)

## 2022-03-15 LAB — URINALYSIS, ROUTINE W REFLEX MICROSCOPIC
Bacteria, UA: NONE SEEN
Bilirubin Urine: NEGATIVE
Glucose, UA: NEGATIVE mg/dL
Hgb urine dipstick: NEGATIVE
Ketones, ur: NEGATIVE mg/dL
Nitrite: NEGATIVE
Protein, ur: NEGATIVE mg/dL
Specific Gravity, Urine: 1.014 (ref 1.005–1.030)
pH: 6 (ref 5.0–8.0)

## 2022-03-15 LAB — RESP PANEL BY RT-PCR (FLU A&B, COVID) ARPGX2
Influenza A by PCR: NEGATIVE
Influenza B by PCR: NEGATIVE
SARS Coronavirus 2 by RT PCR: NEGATIVE

## 2022-03-15 LAB — COMPREHENSIVE METABOLIC PANEL
ALT: 16 U/L (ref 0–44)
AST: 18 U/L (ref 15–41)
Albumin: 3.7 g/dL (ref 3.5–5.0)
Alkaline Phosphatase: 34 U/L — ABNORMAL LOW (ref 38–126)
Anion gap: 11 (ref 5–15)
BUN: 11 mg/dL (ref 8–23)
CO2: 27 mmol/L (ref 22–32)
Calcium: 9.4 mg/dL (ref 8.9–10.3)
Chloride: 100 mmol/L (ref 98–111)
Creatinine, Ser: 0.92 mg/dL (ref 0.44–1.00)
GFR, Estimated: >60 mL/min (ref >60)
Glucose, Bld: 95 mg/dL (ref 70–99)
Potassium: 3.5 mmol/L (ref 3.5–5.1)
Sodium: 138 mmol/L (ref 135–145)
Total Bilirubin: 0.3 mg/dL (ref 0.3–1.2)
Total Protein: 6.7 g/dL (ref 6.5–8.1)

## 2022-03-15 LAB — TROPONIN I (HIGH SENSITIVITY)
Troponin I (High Sensitivity): 4 ng/L (ref <18)
Troponin I (High Sensitivity): 3 ng/L (ref <18)

## 2022-03-15 MED ORDER — LIDOCAINE VISCOUS HCL 2 % MT SOLN
15.0000 mL | Freq: Once | OROMUCOSAL | Status: AC
  Administered 2022-03-15: 15 mL via ORAL
  Filled 2022-03-15: qty 15

## 2022-03-15 MED ORDER — DICYCLOMINE HCL 10 MG/5ML PO SOLN
10.0000 mg | Freq: Once | ORAL | Status: AC
Start: 2022-03-15 — End: 2022-03-15
  Administered 2022-03-15: 10 mg via ORAL
  Filled 2022-03-15: qty 5

## 2022-03-15 MED ORDER — ALUM & MAG HYDROXIDE-SIMETH 200-200-20 MG/5ML PO SUSP
30.0000 mL | Freq: Once | ORAL | Status: AC
Start: 2022-03-15 — End: 2022-03-15
  Administered 2022-03-15: 30 mL via ORAL
  Filled 2022-03-15: qty 30

## 2022-03-15 NOTE — ED Triage Notes (Signed)
Pt arrived POV c/o nausea, heartburn and indegestion for 2-3 days now. Pt states it is making my chest burn.  ?

## 2022-03-15 NOTE — Discharge Instructions (Addendum)
You were evaluated in the Emergency Department and after careful evaluation, we did not find any emergent condition requiring admission or further testing in the hospital.   ? ?Your exam/testing today was overall reassuring.  Continue taking your Protonix 20 mg as prescribed.  Please follow-up with your GI doctor for follow-up regarding these complaints. Consider Maalox over-the-counter to help with your symptoms when they flareup.   ? ?Please return to the Emergency Department if you experience any worsening of your condition. If you start developing chest pain or shortness of breath please return to the ED. Thank you for allowing [place] to be a part of your care.  ?

## 2022-03-15 NOTE — ED Provider Notes (Signed)
?[facility] ?Provider Note ? ? ?CSN: [id number] ?Arrival date & time: 03/15/22  1126 ? ?  ? ?History ?Chief Complaint  ?Patient presents with  ? Nausea  ? Gastroesophageal Reflux  ? ? ?[name] is a 66 y.o. female w/ PMHx of CAD s/p PCI (proximal to mid LAD and PTCA of jailed diagonal branch in 2013,STEMI in 12/2014 with DES to distal RCA), ischemic cardiomyopathy (EF 50-55% in 2016), HTN, HLD and tobacco use presenting for intermittent sensation like something is stuck in her throat for the past 3 weeks. She feels like if she vomited it would go away or if she belched. Minimal chest discomfort but just cause it feels like something is stuck. Has tried OTC heartburn pain medications without relief. Endorsing a sore throat. Denying fevers, chills, chest pain, shortness of breath, abd pain, fevers, or chills.  Patient has had multiple life stressors recently.  Friend unexpectedly died a few days ago.  She additionally reports swallowing a fishbone approximately a month ago.  The pain has not changed since it started but it does come and go.  She is able to swallow food and liquids without any pain or discomfort.  Patient does endorse mild congestion. ? ? ?Gastroesophageal Reflux ?Pertinent negatives include no chest pain and no shortness of breath.  ? ?  ? ?Home Medications ?Prior to Admission medications   ?Medication Sig Start Date End Date Taking? Authorizing Provider  ?aspirin EC 81 MG tablet Take 81 mg by mouth daily.     [provider]  ?Aspirin-Salicylamide-Caffeine 325-95-16 MG TABS Take 1 packet by mouth daily as needed (for pain).    [provider]  ?b complex vitamins tablet Take 1 tablet by mouth daily.    [provider]  ?benazepril-hydrochlorthiazide (LOTENSIN HCT) 20-25 MG per tablet Take 1 tablet by mouth daily. <please make appointment with cardiologist for refills> 08/25/14   Hilty, Lisette AbuKenneth C, MD  ?carvedilol (COREG) 6.25 MG  tablet Take 1 tablet (6.25 mg total) by mouth 2 (two) times daily. 12/12/21   Dyann KiefLenze, Michele M, PA-C  ?diazepam (VALIUM) 5 MG tablet Take 5 mg by mouth at bedtime as needed (for sleep).     [provider]  ?docusate sodium (COLACE) 100 MG capsule Take 100 mg by mouth daily.    [provider]  ?ferrous sulfate 325 (65 FE) MG tablet Take 325 mg by mouth daily with breakfast.    [provider]  ?nitroGLYCERIN (NITROSTAT) 0.4 MG SL tablet PLACE ONE TABLET UNDER THE TONGUE EVERY FIVE MINUTES FOR THREE DOSES AS NEEDED 12/12/21   Dyann KiefLenze, Michele M, PA-C  ?pantoprazole (PROTONIX) 20 MG tablet Take 20 mg by mouth daily.    [provider]  ?potassium chloride SA (K-DUR,KLOR-CON) 20 MEQ tablet Take 1 tablet (20 mEq total) by mouth 2 (two) times daily. ?Patient taking differently: Take 20 mEq by mouth daily. 07/18/17   Benjiman CorePickering, Nathan, MD  ?rosuvastatin (CRESTOR) 5 MG tablet Take 5 mg by mouth every morning.     [provider]  ?   ? ?Allergies    ?Lipitor [atorvastatin]   ? ?Review of Systems   ?Review of Systems  ?Constitutional:  Negative for chills and fever.  ?HENT:  Positive for sore throat. Negative for trouble swallowing and voice change.   ?Respiratory:  Negative for choking and shortness of breath.   ?Cardiovascular:  Negative for chest pain.  ?Gastrointestinal:  Negative for nausea and vomiting.  ?Skin:  Negative for rash and wound.  ? ?Physical Exam ?Updated Vital Signs ?BP (!) 139/101 (BP Location: Right Arm)   Pulse 67   Temp 98 ?F (36.7 ?C) (Oral)   Resp 16   Ht 5\' 4"  (1.626 m)   Wt 86.2 kg   SpO2 99%   BMI 32.61 kg/m?  ?Physical Exam ?Vitals and nursing note reviewed.  ?Constitutional:   ?   General: She is not in acute distress. ?   Appearance: She is not ill-appearing.  ?HENT:  ?   Head: Normocephalic and atraumatic.  ?   Right Ear: Tympanic membrane, ear canal and external ear normal.  ?   Left Ear: Tympanic membrane, ear canal and external ear normal.   ?   Mouth/Throat:  ?   Mouth: Mucous membranes are moist.  ?   Pharynx: Oropharynx is clear.  ?Cardiovascular:  ?   Rate and Rhythm: Normal rate and regular rhythm.  ?Pulmonary:  ?   Effort: Pulmonary effort is normal. No respiratory distress.  ?Chest:  ?   Chest wall: No tenderness.  ?Abdominal:  ?   Tenderness: There is no abdominal tenderness. There is no guarding or rebound.  ?Neurological:  ?   Mental Status: She is alert and oriented to person, place, and time.  ?Psychiatric:     ?   Mood and Affect: Mood normal.     ?   Behavior: Behavior normal.  ? ? ?ED Results / Procedures / Treatments   ?Labs ?(all labs ordered are listed, but only abnormal results are displayed) ?Labs Reviewed  ?CBC WITH DIFFERENTIAL/PLATELET - Abnormal; Notable for the following components:  ?    Result Value  ? Neutro Abs 1.2 (*)   ? All other components within normal limits  ?COMPREHENSIVE METABOLIC PANEL - Abnormal; Notable for the following components:  ? Alkaline Phosphatase 34 (*)   ? All other components within normal limits  ?URINALYSIS, ROUTINE W REFLEX MICROSCOPIC - Abnormal; Notable for the following components:  ? Leukocytes,Ua MODERATE (*)   ? All other components within normal limits  ?RESP PANEL BY RT-PCR (FLU A&B, COVID) [id number]  ?TROPONIN I (HIGH SENSITIVITY)  ?TROPONIN I (HIGH SENSITIVITY)  ? ? ?EKG ?None ? ?Radiology ?DG Chest 2 View ? ?Result Date: 03/15/2022 ?CLINICAL DATA:  Chest discomfort, nausea EXAM: CHEST - 2 VIEW COMPARISON:  07/18/2017 FINDINGS: The heart size and mediastinal contours are within normal limits. Both lungs are clear. Disc degenerative disease of the thoracic spine. IMPRESSION: No acute abnormality of the lungs. Electronically Signed   By: 07/20/2017 M.D.   On: 03/15/2022 12:13   ? ?Procedures ?Procedures  ? ?Medications Ordered in ED ?Medications  ?alum & mag hydroxide-simeth (MAALOX/MYLANTA) 200-200-20 MG/5ML suspension 30 mL (30 mLs Oral Given 03/15/22 1607)  ?  And  ?lidocaine (XYLOCAINE)  2 % viscous mouth solution 15 mL (15 mLs Oral Given 03/15/22 1607)  ?dicyclomine (BENTYL) 10 MG/5ML solution 10 mg (10 mg Oral Given 03/15/22 1639)  ? ? ?ED Course/ Medical Decision Making/ A&P ?  ?                        ?Medical Decision Making ?Risk ?OTC drugs. ?Prescription drug management. ? ?RUBYE STROHMEYER is a 66 y.o. female w/ PMHx of CAD s/p PCI (proximal to mid LAD and PTCA of jailed diagonal branch in 2013,STEMI in 12/2014 with DES to distal RCA), ischemic cardiomyopathy (EF 50-55% in 2016), HTN, HLD and tobacco use  presenting for intermittent sensation like something is stuck in her throat for the past 3 weeks. ? ?On exam, hemodynamically stable.  No acute distress.  Due to history of ACS with prior PCI, delta troponins obtained initial 4 and repeat 3.  EKG with T wave inversions in 2, 3 and aVF which are consistent with her prior EKGs.  Repeat EKG shows no acute changes.  UA overall reassuring with moderate leukocytes and no nitrites or WBCs seen.  CMP and CBC unremarkable.  Chest x-ray on my review with no focal findings which is confirmed radiology.  Patient is established with a gastroenterologist.  Significant improvement of symptoms with a GI cocktail including Maalox, viscous lidocaine, and Bentyl.  Patient says the sensation is no longer present.  Recommending she follow-up with them for possible EGD to further evaluate her GERD versus trauma secondary to fish bone.  And continue taking her Protonix as prescribed.  Patient verbalized agreement understand with plan.  Strict return precautions given.  Patient verbalized agreement understand with plan. ? ?Patient seen in conjunction with my attending Dr. Hyacinth Meeker. ? ?Final Clinical Impression(s) / ED Diagnoses ?Final diagnoses:  ?Nausea  ? ? ?Rx / DC Orders ?ED Discharge Orders   ? ? None  ? ?  ? ? ?  ?Micheline Maze, MD ?03/15/22 2032 ? ?  ?Eber Hong, MD ?03/16/22 1443 ? ?

## 2022-03-15 NOTE — ED Provider Notes (Signed)
Well-appearing 66 year old female, she does have a history of prior coronary disease but presents today with some vague discomfort in her chest that she describes as a burning mostly in the morning, it is not exertional, she is symptom-free at this time.  EKG shows some inverted T waves which have been seen on prior EKGs.  Labs been normal including 2 troponins.  Chest x-ray shows no signs of foreign body pneumothorax or abnormal mediastinum.  She will follow-up with gastroenterology, she looks well for discharge, this does not appear to be cardiac in etiology.  The patient is agreeable to the plan.  I followed up with the gastroenterologist by in basket message to help arrange follow-up ? ?I saw and evaluated the patient, reviewed the resident's note and I agree with the findings and plan. ? ? ?I personally interpreted the EKG as well as the resident and agree with the interpretation on the resident's chart. ? ?Final diagnoses:  ?Nausea  ? ? ?  ?Doris Chapel, MD ?03/16/22 1443 ? ?

## 2022-03-15 NOTE — ED Provider Triage Note (Signed)
Emergency Medicine Provider Triage Evaluation Note ? ?[name] , a 66 y.o. female  was evaluated in triage.  Pt complains of a few days of nausea, heartburn symptoms, on and off chest discomfort. Patient endorses hx of CAD with stendts, STEMI in 2016. Reports she has had some life stressors recently. Denies overt chest pain, shob. Denies fever, chills, diarrhea. Tried OTC reflux medications without relief. Does also endorse some sore throat. ? ?Review of Systems  ?Positive: Chest discomfort, nausea, vomiting ?Negative: Chest pain, shob ? ?Physical Exam  ?BP (!) 139/101 (BP Location: Right Arm)   Pulse 67   Temp 98 ?F (36.7 ?C) (Oral)   Resp 16   SpO2 99%  ?Gen:   Awake, no distress   ?Resp:  Normal effort  ?MSK:   Moves extremities without difficulty  ?Other:  Normal heart sounds, rate, rhythm ? ?Medical Decision Making  ?Medically screening exam initiated at 11:53 AM.  Appropriate orders placed.  [name] was informed that the remainder of the evaluation will be completed by another provider, this initial triage assessment does not replace that evaluation, and the importance of remaining in the ED until their evaluation is complete. ? ?Workup initiated ?  ?Anselmo Pickler, PA-C ?03/15/22 1155 ? ?

## 2022-03-26 ENCOUNTER — Other Ambulatory Visit (INDEPENDENT_AMBULATORY_CARE_PROVIDER_SITE_OTHER): Payer: Self-pay

## 2022-03-26 ENCOUNTER — Encounter (INDEPENDENT_AMBULATORY_CARE_PROVIDER_SITE_OTHER): Payer: Self-pay

## 2022-03-26 ENCOUNTER — Other Ambulatory Visit: Payer: Self-pay

## 2022-03-26 ENCOUNTER — Encounter (INDEPENDENT_AMBULATORY_CARE_PROVIDER_SITE_OTHER): Payer: Self-pay | Admitting: Internal Medicine

## 2022-03-26 ENCOUNTER — Ambulatory Visit (INDEPENDENT_AMBULATORY_CARE_PROVIDER_SITE_OTHER): Payer: Medicare HMO | Admitting: Internal Medicine

## 2022-03-26 DIAGNOSIS — R140 Abdominal distension (gaseous): Secondary | ICD-10-CM

## 2022-03-26 DIAGNOSIS — Z01812 Encounter for preprocedural laboratory examination: Secondary | ICD-10-CM

## 2022-03-26 DIAGNOSIS — R110 Nausea: Secondary | ICD-10-CM

## 2022-03-26 DIAGNOSIS — I10 Essential (primary) hypertension: Secondary | ICD-10-CM

## 2022-03-26 MED ORDER — PANTOPRAZOLE SODIUM 40 MG PO TBEC: 40.0000 mg | DELAYED_RELEASE_TABLET | Freq: Every day | ORAL | 5 refills | Status: DC

## 2022-03-26 NOTE — Progress Notes (Signed)
Presenting complaint; ? ?Postprandial nausea bloating and early satiety. ? ?History of present illness ? ?Patient is 66 year old African-American female who is referred through courtesy of Dr. Lemmie Evens for GI evaluation. ?Patient states she began to have symptoms in early January this year.  She says her dog was attacked by her neighbors pitbull on Christmas Day.  She took her dog to the vet and she needed extensive surgery at a cost of several thousand dollars.  Patient states vet informed her that the dog would not be able to walk.  Therefore the dog was put to sleep.  Ever since she is been sick.  She feels bloated.  She is not able to eat regular meals.  She gets filled up quickly.  She also has urge to belch but she is not able to.  She has had few episodes of vomiting.  She was seen at Dr. Vickey Sages office and begun on low-dose PPI.  Patient feels better but not significantly.  She has lost over 30 pounds in the last 3 months.  She feels some of her weight loss is voluntary.  She has taken few doses of Mylanta but it did not help.  She also has changed her eating habits.  She has been using Goody powder on as-needed basis.  She denies dysphagia or abdominal pain.  She also denies melena or rectal bleeding.  Since she was begun on p.o. iron she has become constipated and her stool is black.  She says her appetite is good but she is afraid to eat.  No prior history of peptic ulcer disease. ? ?Current Medications: ?Outpatient Encounter Medications as of 03/26/2022  ?Medication Sig  ? aspirin EC 81 MG tablet Take 81 mg by mouth daily.   ? benazepril-hydrochlorthiazide (LOTENSIN HCT) 20-25 MG per tablet Take 1 tablet by mouth daily. <please make appointment with cardiologist for refills>  ? Calcium Carbonate Antacid (MAALOX PO) Take by mouth. 1 tablespoon every morning as needed  ? diazepam (VALIUM) 5 MG tablet Take 5 mg by mouth at bedtime as needed (for sleep).   ? docusate sodium (COLACE) 100 MG capsule  Take 100 mg by mouth daily.  ? ferrous sulfate 325 (65 FE) MG tablet Take 325 mg by mouth. One every other day  ? nitroGLYCERIN (NITROSTAT) 0.4 MG SL tablet PLACE ONE TABLET UNDER THE TONGUE EVERY FIVE MINUTES FOR THREE DOSES AS NEEDED  ? pantoprazole (PROTONIX) 20 MG tablet Take 20 mg by mouth daily.  ? potassium chloride SA (K-DUR,KLOR-CON) 20 MEQ tablet Take 1 tablet (20 mEq total) by mouth 2 (two) times daily. (Patient taking differently: Take 20 mEq by mouth daily.)  ? rosuvastatin (CRESTOR) 5 MG tablet Take 5 mg by mouth every morning.   ? carvedilol (COREG) 6.25 MG tablet Take 1 tablet (6.25 mg total) by mouth 2 (two) times daily. (Patient not taking: Reported on 03/26/2022)  ? [DISCONTINUED] Aspirin-Salicylamide-Caffeine 825-00-37 MG TABS Take 1 packet by mouth daily as needed (for pain).  ? [DISCONTINUED] b complex vitamins tablet Take 1 tablet by mouth daily.  ? ?No facility-administered encounter medications on file as of 03/26/2022.  ? ?Past medical history ? ?Coronary artery disease.  She had stenting to proximal and mid LAD in 2013.  In January 2016 she suffered STEMI and had stent to distal RCA.  EF in January 2016 was 50 to 55%. ?Hypertension. ?Osteoarthrosis of both knees.  She had right knee replacement 7 years ago. ?She was diagnosed with iron deficiency anemia few  months ago.  She states her stool was guaiac negative. ?Cholecystectomy at age 63. ?Cologuard test negative within the last 2 to 3 years. ? ?Allergies ? ?Allergies  ?Allergen Reactions  ? Lipitor [Atorvastatin] Other (See Comments)  ?  Severe bone aches  ? ?Family history ? ?Father is 38 years old.  He has coronary artery disease and was treated for prostate cancer.  Mother is 58 years old.  She lost brother of MI at age 62.  She has she has 5 sisters.  2 of her sisters have rheumatoid arthritis and the third sister has osteoarthrosis. ? ?Social history ? ?She is widowed.  She works as a Actuary for 20 years.  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  She has been smoking cigarettes for about 20 years as much as 2 packs/day but now trying to quit and down to 7 cigarettes/day.  She does not drink alcohol.  She has biologic son with bronchial asthma and she has 2 stepsons in good health. ? ?Objective: ?Blood pressure 113/72, pulse 64, temperature 97.7 ?F (36.5 ?C), temperature source Oral, height _0  (1.702 m), weight 196 lb 9.6 oz (89.2 kg). ?Patient is alert and in no acute distress ?Conjunctiva is pink. Sclera is nonicteric ?Oropharyngeal mucosa is normal. ?No neck masses or thyromegaly noted. ?Cardiac exam with regular rhythm normal S1 and S2. No murmur or gallop noted. ?Lungs are clear to auscultation. ?Abdomen is full.  Bowel sounds are normal.  On palpation abdomen is soft with mild midepigastric tenderness on deep palpation.  No organomegaly or masses. ?No LE edema or clubbing noted. ? ?Labs/studies Results: ? ? ? ?  Latest Ref Rng & Units 03/15/2022  ? 11:55 AM 07/18/2017  ?  5:13 PM 12/31/2014  ?  2:12 AM  ?CBC  ?WBC 4.0 - 10.5 K/uL 4.2   4.7   4.8    ?Hemoglobin 12.0 - 15.0 g/dL 12.0   12.4   11.7    ?Hematocrit 36.0 - 46.0 % 38.0   37.9   36.3    ?Platelets 150 - 400 K/uL 255   258   240    ?  ? ?  Latest Ref Rng & Units 03/15/2022  ? 11:55 AM 07/18/2017  ?  5:13 PM 12/31/2014  ?  2:12 AM  ?CMP  ?Glucose 70 - 99 mg/dL 95   110   99    ?BUN 8 - 23 mg/dL _1 ?Creatinine 0.44 - 1.00 mg/dL 0.92   0.90   0.79    ?Sodium 135 - 145 mmol/L 138   136   140    ?Potassium 3.5 - 5.1 mmol/L 3.5   2.8   3.4    ?Chloride 98 - 111 mmol/L 100   102   110    ?CO2 22 - 32 mmol/L _2 ?Calcium 8.9 - 10.3 mg/dL 9.4   9.0   8.5    ?Total Protein 6.5 - 8.1 g/dL 6.7   6.7     ?Total Bilirubin 0.3 - 1.2 mg/dL 0.3   0.5     ?Alkaline Phos 38 - 126 U/L 34   47     ?AST 15 - 41 U/L 18   15     ?ALT 0 - 44 U/L 16   14     ?  ? ?  Latest Ref Rng & Units 03/15/2022  ? 11:55 AM 07/18/2017  ?  5:13 PM 07/12/2012  ?  9:25 PM   ?Hepatic Function  ?Total Protein 6.5 - 8.1 g/dL 6.7   6.7   7.0    ?Albumin 3.5 - 5.0 g/dL 3.7   3.6   3.5    ?AST 15 - 41 U/L _0 ?ALT 0 - 44 U/L _1 ?Alk Phosphatase 38 - 126 U/L 34   47   63    ?Total Bilirubin 0.3 - 1.2 mg/dL 0.3   0.5   0.2    ?  ? ? ?Assessment: ? ?Patient is 66 year old African-American female who presents with 79-monthhistory of postprandial nausea sporadic vomiting/heaving bloating early satiety and weight loss.  Symptoms started after her dog had to be put to sleep.  She has been using Goody powders on as-needed basis.  She has noted partial improvement with low-dose pantoprazole i.e. 20 mg daily.  She denies heartburn. ?Her symptoms are suggestive of peptic ulcer disease. ? ?#2.  History of iron deficiency anemia.  Cologuard testing few years ago was negative.  Recent stool guaiac was negative.  Her H&H is normal.  ? ? ?Plan: ? ?Increase pantoprazole to 40 mg by mouth 30 minutes before breakfast daily. ?Patient advised to refrain from using Goody powder or other NSAIDs. ?Diagnostic esophagogastroduodenoscopy in near future. ? ? ? ? ? ?

## 2022-03-26 NOTE — Patient Instructions (Signed)
Take pantoprazole 40 mg by mouth 30 minutes for breakfast daily. ?Please remember not to take Goody powder or similar medications since you already on low-dose aspirin. ?

## 2022-04-30 ENCOUNTER — Other Ambulatory Visit (HOSPITAL_COMMUNITY)
Admission: RE | Admit: 2022-04-30 | Discharge: 2022-04-30 | Disposition: A | Discharge status: Home / Self Care | Payer: Medicare HMO | Source: Ambulatory Visit | Attending: Internal Medicine | Admitting: Internal Medicine

## 2022-04-30 DIAGNOSIS — I2510 Atherosclerotic heart disease of native coronary artery without angina pectoris: Secondary | ICD-10-CM | POA: Diagnosis not present

## 2022-04-30 DIAGNOSIS — I110 Hypertensive heart disease with heart failure: Secondary | ICD-10-CM | POA: Diagnosis not present

## 2022-04-30 DIAGNOSIS — K2970 Gastritis, unspecified, without bleeding: Secondary | ICD-10-CM | POA: Diagnosis not present

## 2022-04-30 DIAGNOSIS — K449 Diaphragmatic hernia without obstruction or gangrene: Secondary | ICD-10-CM | POA: Diagnosis not present

## 2022-04-30 DIAGNOSIS — R634 Abnormal weight loss: Secondary | ICD-10-CM | POA: Diagnosis not present

## 2022-04-30 DIAGNOSIS — R140 Abdominal distension (gaseous): Secondary | ICD-10-CM | POA: Diagnosis present

## 2022-04-30 DIAGNOSIS — K3189 Other diseases of stomach and duodenum: Secondary | ICD-10-CM | POA: Diagnosis not present

## 2022-04-30 DIAGNOSIS — Z955 Presence of coronary angioplasty implant and graft: Secondary | ICD-10-CM | POA: Diagnosis not present

## 2022-04-30 DIAGNOSIS — I252 Old myocardial infarction: Secondary | ICD-10-CM | POA: Diagnosis not present

## 2022-04-30 DIAGNOSIS — K31819 Angiodysplasia of stomach and duodenum without bleeding: Secondary | ICD-10-CM | POA: Diagnosis not present

## 2022-04-30 DIAGNOSIS — I509 Heart failure, unspecified: Secondary | ICD-10-CM | POA: Diagnosis not present

## 2022-04-30 DIAGNOSIS — R110 Nausea: Secondary | ICD-10-CM | POA: Insufficient documentation

## 2022-04-30 DIAGNOSIS — F17200 Nicotine dependence, unspecified, uncomplicated: Secondary | ICD-10-CM | POA: Diagnosis not present

## 2022-04-30 LAB — BASIC METABOLIC PANEL
Anion gap: 7 (ref 5–15)
BUN: 8 mg/dL (ref 8–23)
CO2: 29 mmol/L (ref 22–32)
Calcium: 9.7 mg/dL (ref 8.9–10.3)
Chloride: 99 mmol/L (ref 98–111)
Creatinine, Ser: 0.76 mg/dL (ref 0.44–1.00)
GFR, Estimated: >60 mL/min (ref >60)
Glucose, Bld: 98 mg/dL (ref 70–99)
Potassium: 3.3 mmol/L — ABNORMAL LOW (ref 3.5–5.1)
Sodium: 135 mmol/L (ref 135–145)

## 2022-05-02 ENCOUNTER — Encounter (HOSPITAL_COMMUNITY): Payer: Self-pay | Admitting: Internal Medicine

## 2022-05-02 ENCOUNTER — Ambulatory Visit (HOSPITAL_BASED_OUTPATIENT_CLINIC_OR_DEPARTMENT_OTHER): Payer: Medicare HMO | Admitting: Certified Registered Nurse Anesthetist

## 2022-05-02 ENCOUNTER — Ambulatory Visit (HOSPITAL_COMMUNITY): Payer: Medicare HMO | Admitting: Certified Registered Nurse Anesthetist

## 2022-05-02 ENCOUNTER — Other Ambulatory Visit: Payer: Self-pay

## 2022-05-02 ENCOUNTER — Ambulatory Visit (HOSPITAL_COMMUNITY)
Admission: RE | Admit: 2022-05-02 | Discharge: 2022-05-02 | Disposition: A | Discharge status: Home / Self Care | Payer: Medicare HMO | Attending: Internal Medicine | Admitting: Internal Medicine

## 2022-05-02 ENCOUNTER — Encounter (HOSPITAL_COMMUNITY)
Admission: RE | Disposition: A | Discharge status: Home / Self Care | Payer: Self-pay | Source: Home / Self Care | Attending: Internal Medicine

## 2022-05-02 DIAGNOSIS — K31819 Angiodysplasia of stomach and duodenum without bleeding: Secondary | ICD-10-CM

## 2022-05-02 DIAGNOSIS — I252 Old myocardial infarction: Secondary | ICD-10-CM | POA: Insufficient documentation

## 2022-05-02 DIAGNOSIS — K3189 Other diseases of stomach and duodenum: Secondary | ICD-10-CM

## 2022-05-02 DIAGNOSIS — I110 Hypertensive heart disease with heart failure: Secondary | ICD-10-CM | POA: Insufficient documentation

## 2022-05-02 DIAGNOSIS — K2970 Gastritis, unspecified, without bleeding: Secondary | ICD-10-CM

## 2022-05-02 DIAGNOSIS — K449 Diaphragmatic hernia without obstruction or gangrene: Secondary | ICD-10-CM

## 2022-05-02 DIAGNOSIS — F17200 Nicotine dependence, unspecified, uncomplicated: Secondary | ICD-10-CM | POA: Insufficient documentation

## 2022-05-02 DIAGNOSIS — R110 Nausea: Secondary | ICD-10-CM

## 2022-05-02 DIAGNOSIS — Z955 Presence of coronary angioplasty implant and graft: Secondary | ICD-10-CM | POA: Insufficient documentation

## 2022-05-02 DIAGNOSIS — I509 Heart failure, unspecified: Secondary | ICD-10-CM | POA: Insufficient documentation

## 2022-05-02 DIAGNOSIS — I2510 Atherosclerotic heart disease of native coronary artery without angina pectoris: Secondary | ICD-10-CM | POA: Insufficient documentation

## 2022-05-02 DIAGNOSIS — R140 Abdominal distension (gaseous): Secondary | ICD-10-CM

## 2022-05-02 DIAGNOSIS — R634 Abnormal weight loss: Secondary | ICD-10-CM

## 2022-05-02 HISTORY — PX: BIOPSY: SHX5522

## 2022-05-02 HISTORY — PX: ESOPHAGOGASTRODUODENOSCOPY (EGD) WITH PROPOFOL: SHX5813

## 2022-05-02 SURGERY — ESOPHAGOGASTRODUODENOSCOPY (EGD) WITH PROPOFOL
Anesthesia: General

## 2022-05-02 MED ORDER — LIDOCAINE HCL (CARDIAC) PF 100 MG/5ML IV SOSY
PREFILLED_SYRINGE | INTRAVENOUS | Status: DC | PRN
Start: 2022-05-02 — End: 2022-05-02
  Administered 2022-05-02: 50 mg via INTRAVENOUS

## 2022-05-02 MED ORDER — LACTATED RINGERS IV SOLN: INTRAVENOUS | Status: DC | PRN

## 2022-05-02 MED ORDER — PROPOFOL 10 MG/ML IV BOLUS
INTRAVENOUS | Status: DC | PRN
  Administered 2022-05-02: 100 mg via INTRAVENOUS
  Administered 2022-05-02: 30 mg via INTRAVENOUS
  Administered 2022-05-02: 20 mg via INTRAVENOUS

## 2022-05-02 NOTE — Anesthesia Postprocedure Evaluation (Signed)
Anesthesia Post Note ? ?Patient: [name] ? ?Procedure(s) Performed: ESOPHAGOGASTRODUODENOSCOPY (EGD) WITH PROPOFOL ?BIOPSY ? ?Patient location during evaluation: Phase II ?Anesthesia Type: General ?Level of consciousness: awake ?Pain management: pain level controlled ?Vital Signs Assessment: post-procedure vital signs reviewed and stable ?Respiratory status: spontaneous breathing and respiratory function stable ?Cardiovascular status: blood pressure returned to baseline and stable ?Postop Assessment: no headache and no apparent nausea or vomiting ?Anesthetic complications: no ?Comments: Late entry ? ? ?No notable events documented. ? ? ?Last Vitals:  ?Vitals:  ? 05/02/22 0959 05/02/22 1002  ?BP: (!) 81/50 100/69  ?Pulse:    ?Resp: (!) 23   ?Temp: (!) 36.4 ?C   ?SpO2: 94%   ?  ?Last Pain:  ?Vitals:  ? 05/02/22 0959  ?TempSrc: Oral  ?PainSc:   ? ? ?  ?  ?  ?  ?  ?  ? ?Windell [place] ? ? ? ? ?

## 2022-05-02 NOTE — H&P (Signed)
[name] is an 66 y.o. female.   ?Chief Complaint: Patient is here for diagnostic esophagogastroduodenoscopy ?HPI: Patient is 66 year old African-American female who was seen in the office 5 weeks ago with 54-month history of postprandial nausea bloating sporadic vomiting and 10 pound weight loss.  She also gave history of dark stools but she had been on iron.  Her H&H is normal.  She was advised to stop iron.  Stool color has been normal.  Her PPI dose was doubled up to 40 mg daily.  She states she is 50% better.  She denies abdominal pain dysphagia or heartburn.  She states she is also getting her teeth fixed.  She is not taking Goody's powder anymore. ? ? ?Past Medical History:  ?Diagnosis Date  ? Arthritis   ? Coronary artery disease   ? a. s/p stenting of the proximal to mid LAD and PTCA of jailed diagonal branch in 2013 b. STEMI in 12/2014 with DES to distal RCA  ? Hypertension   ? Ischemic cardiomyopathy   ? a. EF 40-45% in 2013 b. EF 55% in 2016  ? ? ?Past Surgical History:  ?Procedure Laterality Date  ? CARDIAC CATHETERIZATION    ? CHOLECYSTECTOMY    ? CORONARY STENT PLACEMENT  07/15/12  ? KNEE SURGERY    ? LEFT HEART CATHETERIZATION WITH CORONARY ANGIOGRAM N/A 07/13/2012  ? Procedure: LEFT HEART CATHETERIZATION WITH CORONARY ANGIOGRAM;  Surgeon: Leonie Man, MD;  Location: [facility] CATH LAB;  Service: Cardiovascular;  Laterality: N/A;  ? LEFT HEART CATHETERIZATION WITH CORONARY ANGIOGRAM N/A 11/20/2012  ? Procedure: LEFT HEART CATHETERIZATION WITH CORONARY ANGIOGRAM;  Surgeon: Leonie Man, MD;  Location: [facility] CATH LAB;  Service: Cardiovascular;  Laterality: N/A;  ? LEFT HEART CATHETERIZATION WITH CORONARY ANGIOGRAM N/A 12/30/2014  ? Procedure: LEFT HEART CATHETERIZATION WITH CORONARY ANGIOGRAM;  Surgeon: Troy Sine, MD;  Location: [facility] CATH LAB;  Service: Cardiovascular;  Laterality: N/A;  ? PERCUTANEOUS CORONARY STENT INTERVENTION (PCI-S)  07/13/2012  ? Procedure: PERCUTANEOUS CORONARY STENT INTERVENTION  (PCI-S);  Surgeon: Leonie Man, MD;  Location: [facility] CATH LAB;  Service: Cardiovascular;;  ? PERCUTANEOUS CORONARY STENT INTERVENTION (PCI-S)  12/30/2014  ? Procedure: PERCUTANEOUS CORONARY STENT INTERVENTION (PCI-S);  Surgeon: Troy Sine, MD;  Location: [facility] CATH LAB;  Service: Cardiovascular;;  ? ? ?Family History  ?Problem Relation Age of Onset  ? Hypertension Mother   ? Hypertension Father   ? Hypertension Sister   ? Hypertension Brother   ? Coronary artery disease Brother 47  ? ?Social History:  reports that she has been smoking cigarettes. She started smoking about 45 years ago. She has a 5.00 pack-year smoking history. She has been exposed to tobacco smoke. She has never used smokeless tobacco. She reports current drug use. Drug: Marijuana. She reports that she does not drink alcohol. ? ?Allergies:  ?Allergies  ?Allergen Reactions  ? Lipitor [Atorvastatin] Other (See Comments)  ?  Severe bone aches  ? ? ?Medications Prior to Admission  ?Medication Sig Dispense Refill  ? acetaminophen (TYLENOL) 650 MG CR tablet Take 1,300 mg by mouth every 8 (eight) hours as needed for pain.    ? amoxicillin (AMOXIL) 500 MG capsule Take 500 mg by mouth 2 (two) times daily.    ? aspirin EC 81 MG tablet Take 81 mg by mouth daily.     ? Aspirin-Acetaminophen-Caffeine (GOODY HEADACHE PO) Take 1 packet by mouth daily as needed (pain).    ? benazepril-hydrochlorthiazide (LOTENSIN HCT) 20-25 MG  per tablet Take 1 tablet by mouth daily. <please make appointment with cardiologist for refills> 30 tablet 1  ? brimonidine (ALPHAGAN P) 0.1 % SOLN Place 2 drops into the left eye daily as needed (irritation).    ? Calcium Carbonate Antacid (MAALOX PO) Take 1 Dose by mouth daily as needed (indigestion).    ? carvedilol (COREG) 6.25 MG tablet Take 1 tablet (6.25 mg total) by mouth 2 (two) times daily. 180 tablet 1  ? cetirizine (ZYRTEC) 10 MG tablet Take 10 mg by mouth daily.    ? cholecalciferol (VITAMIN D3) 25 MCG (1000 UNIT) tablet Take  1,000 Units by mouth daily.    ? diazepam (VALIUM) 5 MG tablet Take 5 mg by mouth at bedtime as needed (for sleep).     ? Docusate Sodium (COLACE PO) Take 1 tablet by mouth daily.    ? ibuprofen (ADVIL) 200 MG tablet Take 200 mg by mouth every 6 (six) hours as needed for moderate pain.    ? pantoprazole (PROTONIX) 40 MG tablet Take 1 tablet (40 mg total) by mouth daily before breakfast. 30 tablet 5  ? potassium chloride (KLOR-CON) 10 MEQ tablet Take 10 mEq by mouth daily.    ? rosuvastatin (CRESTOR) 10 MG tablet Take 10 mg by mouth daily.    ? Simethicone (GAS-X PO) Take 2 tablets by mouth daily as needed (gas).    ? nitroGLYCERIN (NITROSTAT) 0.4 MG SL tablet PLACE ONE TABLET UNDER THE TONGUE EVERY FIVE MINUTES FOR THREE DOSES AS NEEDED 25 tablet 3  ? ? ?No results found for this or any previous visit (from the past 48 hour(s)). ?No results found. ? ?Review of Systems ? ?Blood pressure (!) 142/88, pulse (!) 56, temperature 97.7 ?F (36.5 ?C), temperature source Oral, resp. rate 18, height 5\' 7"  (1.702 m), weight 87.1 kg, SpO2 94 %. ?Physical Exam ?HENT:  ?   Mouth/Throat:  ?   Mouth: Mucous membranes are moist.  ?   Pharynx: Oropharynx is clear.  ?Eyes:  ?   General: No scleral icterus. ?   Conjunctiva/sclera: Conjunctivae normal.  ?Cardiovascular:  ?   Rate and Rhythm: Normal rate and regular rhythm.  ?   Heart sounds: Normal heart sounds. No murmur heard. ?Pulmonary:  ?   Effort: Pulmonary effort is normal.  ?   Breath sounds: Normal breath sounds.  ?Abdominal:  ?   General: There is no distension.  ?   Palpations: Abdomen is soft. There is no mass.  ?   Tenderness: There is no abdominal tenderness.  ?Musculoskeletal:     ?   General: No swelling.  ?   Cervical back: Neck supple. No rigidity.  ?Lymphadenopathy:  ?   Cervical: No cervical adenopathy.  ?Skin: ?   General: Skin is warm and dry.  ?Neurological:  ?   Mental Status: She is alert.  ?  ? ?Assessment/Plan ? ?Nausea s with sporadic vomiting postprandial  bloating and 10 pound weight loss. ?Diagnostic esophagogastroduodenoscopy. ? ?Hildred Laser, MD ?05/02/2022, 9:38 AM ? ? ? ?

## 2022-05-02 NOTE — Op Note (Signed)
[facility] ?Patient Name: [name] ?Procedure Date: 05/02/2022 9:27 AM ?MRN: [id number] ?Date of Birth: 03-27-1956 ?Attending MD: Lionel December , MD ?CSN: [id number] ?Age: 66 ?Admit Type: Outpatient ?Procedure:                Upper GI endoscopy ?Indications:              Abdominal bloating, Nausea, Weight loss ?Providers:                Lionel December, MD, Angelica Ran, Durwin Glaze  ?                          Tech, Technician ?Referring MD:             Gareth Morgan, MD ?Medicines:                Propofol per Anesthesia ?Complications:            No immediate complications. ?Estimated Blood Loss:     Estimated blood loss was minimal. ?Procedure:                Pre-Anesthesia Assessment: ?                          - Prior to the procedure, a History and Physical  ?                          was performed, and patient medications and  ?                          allergies were reviewed. The patient's tolerance of  ?                          previous anesthesia was also reviewed. The risks  ?                          and benefits of the procedure and the sedation  ?                          options and risks were discussed with the patient.  ?                          All questions were answered, and informed consent  ?                          was obtained. Prior Anticoagulants: The patient has  ?                          taken no previous anticoagulant or antiplatelet  ?                          agents except for aspirin. ASA Grade Assessment: II  ?                          - A patient with mild systemic disease. After  ?  reviewing the risks and benefits, the patient was  ?                          deemed in satisfactory condition to undergo the  ?                          procedure. ?                          After obtaining informed consent, the endoscope was  ?                          passed under direct vision. Throughout the  ?                          procedure, the patient's  blood pressure, pulse, and  ?                          oxygen saturations were monitored continuously. The  ?                          GIF-H190 ([id number] scope was introduced through the  ?                          mouth, and advanced to the second part of duodenum.  ?                          The upper GI endoscopy was accomplished without  ?                          difficulty. The patient tolerated the procedure  ?                          well. ?Scope In: 9:49:04 AM ?Scope Out: 9:55:49 AM ?Total Procedure Duration: 0 hours 6 minutes 45 seconds  ?Findings: ?     The hypopharynx was normal. ?     The examined esophagus was normal. ?     The Z-line was regular and was found 37 cm from the incisors. ?     A 3 cm hiatal hernia was present. ?     Patchy mild inflammation characterized by congestion (edema), erythema  ?     and granularity was found in the gastric antrum and in the prepyloric  ?     region of the stomach. Biopsies were taken with a cold forceps for  ?     histology. ?     A healed ulcer was found in the prepyloric region of the stomach. The  ?     scar tissue was healthy in appearance. ?     The exam of the stomach was otherwise normal. ?     Three small angioectasias without bleeding were found in the duodenal  ?     bulb. ?     The second portion of the duodenum and major papilla were normal. ?Impression:               - Normal hypopharynx. ?                          -  Normal esophagus. ?                          - Z-line regular, 37 cm from the incisors. ?                          - 3 cm hiatal hernia. ?                          - Gastritis. Biopsied. ?                          - Scar in the prepyloric region of the stomach. ?                          - Three non-bleeding angioectasias in the duodenum. ?                          - Normal second portion of the duodenum and major  ?                          papilla. ?Moderate Sedation: ?     Per Anesthesia Care ?Recommendation:           - Resume  previous diet today. ?                          - Continue present medications. ?                          - No aspirin, ibuprofen, naproxen, or other  ?                          non-steroidal anti-inflammatory drugs for 1 day. ?                          - Await pathology results. ?Procedure Code(s):        --- Professional --- ?                          [phone], Esophagogastroduodenoscopy, flexible,  ?                          transoral; with biopsy, single or multiple ?Diagnosis Code(s):        --- Professional --- ?                          K44.9, Diaphragmatic hernia without obstruction or  ?                          gangrene ?                          K29.70, Gastritis, unspecified, without bleeding ?                          K31.89, Other diseases of stomach and duodenum ?  K31.819, Angiodysplasia of stomach and duodenum  ?                          without bleeding ?                          R14.0, Abdominal distension (gaseous) ?                          R11.0, Nausea ?                          R63.4, Abnormal weight loss ?CPT copyright 2019 [employer]. All rights reserved. ?The codes documented in this report are preliminary and upon coder review may  ?be revised to meet current compliance requirements. ?Lionel December, MD ?Lionel December, MD ?05/02/2022 10:04:14 AM ?This report has been signed electronically. ?Number of Addenda: 0 ?

## 2022-05-02 NOTE — Anesthesia Preprocedure Evaluation (Signed)
Anesthesia Evaluation  ?Patient identified by MRN, date of birth, ID band ?Patient awake ? ? ? ?Reviewed: ?Allergy & Precautions, H&P , NPO status , Patient's Chart, lab work & pertinent test results, reviewed documented beta blocker date and time  ? ?Airway ?Mallampati: II ? ?TM Distance: >3 FB ?Neck ROM: full ? ? ? Dental ?no notable dental hx. ? ?  ?Pulmonary ?neg pulmonary ROS, Current Smoker and Patient abstained from smoking.,  ?  ?Pulmonary exam normal ?breath sounds clear to auscultation ? ? ? ? ? ? Cardiovascular ?Exercise Tolerance: Good ?hypertension, + CAD, + Past MI, + Cardiac Stents and +CHF  ? ?Rhythm:regular Rate:Normal ? ? ?  ?Neuro/Psych ?negative neurological ROS ? negative psych ROS  ? GI/Hepatic ?negative GI ROS, Neg liver ROS,   ?Endo/Other  ?negative endocrine ROS ? Renal/GU ?negative Renal ROS  ?negative genitourinary ?  ?Musculoskeletal ? ? Abdominal ?  ?Peds ? Hematology ?negative hematology ROS ?(+)   ?Anesthesia Other Findings ?EF 45-50 ? Reproductive/Obstetrics ?negative OB ROS ? ?  ? ? ? ? ? ? ? ? ? ? ? ? ? ?  ?  ? ? ? ? ? ? ? ? ?Anesthesia Physical ?Anesthesia Plan ? ?ASA: 3 ? ?Anesthesia Plan: General  ? ?Post-op Pain Management:   ? ?Induction:  ? ?PONV Risk Score and Plan: Propofol infusion ? ?Airway Management Planned:  ? ?Additional Equipment:  ? ?Intra-op Plan:  ? ?Post-operative Plan:  ? ?Informed Consent: I have reviewed the patients History and Physical, chart, labs and discussed the procedure including the risks, benefits and alternatives for the proposed anesthesia with the patient or authorized representative who has indicated his/her understanding and acceptance.  ? ? ? ?Dental Advisory Given ? ?Plan Discussed with: CRNA ? ?Anesthesia Plan Comments:   ? ? ? ? ? ? ?Anesthesia Quick Evaluation ? ?

## 2022-05-02 NOTE — Discharge Instructions (Signed)
No aspirin or NSAIDs for 24 hours. ?Resume other medications and diet as before. ?No driving for 24 hours. ?Physician will call with biopsy results. ?

## 2022-05-02 NOTE — Transfer of Care (Signed)
Immediate Anesthesia Transfer of Care Note ? ?Patient: [name] ? ?Procedure(s) Performed: ESOPHAGOGASTRODUODENOSCOPY (EGD) WITH PROPOFOL ?BIOPSY ? ?Patient Location: Endoscopy Unit ? ?Anesthesia Type:General ? ?Level of Consciousness: awake ? ?Airway & Oxygen Therapy: Patient Spontanous Breathing ? ?Post-op Assessment: Report given to RN and Post -op Vital signs reviewed and stable ? ?Post vital signs: Reviewed and stable ? ?Last Vitals:  ?Vitals Value Taken Time  ?BP  05/02/22 0959  ?Temp 36.4 ?C 05/02/22 0959  ?Pulse    ?Resp 23 05/02/22 0959  ?SpO2 94 % 05/02/22 0959  ? ? ?Last Pain:  ?Vitals:  ? 05/02/22 0959  ?TempSrc: Oral  ?PainSc:   ?   ? ?Patients Stated Pain Goal: 5 (05/02/22 1194) ? ?Complications: No notable events documented. ?

## 2022-05-03 LAB — SURGICAL PATHOLOGY

## 2022-05-09 ENCOUNTER — Encounter (HOSPITAL_COMMUNITY): Payer: Self-pay | Admitting: Internal Medicine

## 2022-07-24 ENCOUNTER — Ambulatory Visit: Payer: Medicare HMO | Admitting: Cardiovascular Disease

## 2022-08-08 ENCOUNTER — Encounter (INDEPENDENT_AMBULATORY_CARE_PROVIDER_SITE_OTHER): Payer: Self-pay | Admitting: Gastroenterology

## 2022-08-08 ENCOUNTER — Ambulatory Visit (INDEPENDENT_AMBULATORY_CARE_PROVIDER_SITE_OTHER): Payer: Medicare HMO | Admitting: Gastroenterology

## 2022-08-08 VITALS — BP 129/85 | HR 61 | Temp 97.4°F | Ht 67.0 in | Wt 190.1 lb

## 2022-08-08 DIAGNOSIS — K219 Gastro-esophageal reflux disease without esophagitis: Secondary | ICD-10-CM

## 2022-08-08 NOTE — Patient Instructions (Signed)
Please continue with reflux precautions, avoiding trigger foods, avoiding eating late and staying upright 2-3 hours after eating. You can continue to use your pantoprazole as needed as long as this is working for you. Please let me know if you recall where last colonoscopy was done so that we can try to get these records and keep you up to date.   Follow up 1 year

## 2022-08-08 NOTE — Progress Notes (Signed)
Referring Provider: John Giovanni, MD Primary Care Physician:  John Giovanni, MD Primary GI Physician:   Chief Complaint  Patient presents with   Gastroesophageal Reflux    Follow up on GERD. Takes protonix as needed not daily. States she is doing better since starting protonix.    HPI:   [name] is a 66 y.o. female with past medical history of arthritis, CAD, HTN, ischemic cardiomyopathy.   Patient presenting today for follow up of GERD  Last seen march 2023, having bloating, vomiting, and feeling that she needs to belch but cannot. Started on PPI by PCP with some improvement.   Pantoprazole increased to 40mg  daily, advised to avoid NSAIDs and proceed with EGD.  Last potassium 3.3 in May, potassium increased to 20 mEq by Dr. June.  Hgb in march 12  Present:  She reports she is feeling a lot better. She is only taking protonix 2-3x/week. She reports that she is trying not to eat late and stay upright after eating which has really helped. Only taking goody powder on rare occasion, uses tylenol. Occasionally she will eat late at night and will have to take her PPI the next morning. She is avoiding fried foods which also helps to prevent symptoms. Denies abdominal pain, nausea, vomiting. She is having a BM daily and trying to exercise. No rectal bleeding, melena, weight loss or change in appeite. She feels that GI symptoms are well controlled.   She is still taking potassium 10 meq and PCP is monitoring this routinely, last labs were done about 1 month ago and potassium was normal per patient.   Last Colonoscopy:3-4 years ago, unsure where this was done, normal per patient.  Last Endoscopy:May 2023 - Normal hypopharynx. - Normal esophagus. - Z-line regular, 37 cm from the incisors. - 3 cm hiatal hernia. - Gastritis. Biopsied-Negative for h pylori - Scar in the prepyloric region of the stomach. - Three non-bleeding angioectasias in the duodenum. - Normal second  portion of the duodenum and major papilla.  Recommendations:    Past Medical History:  Diagnosis Date   Arthritis    Coronary artery disease    a. s/p stenting of the proximal to mid LAD and PTCA of jailed diagonal branch in 2013 b. STEMI in 12/2014 with DES to distal RCA   Hypertension    Ischemic cardiomyopathy    a. EF 40-45% in 2013 b. EF 55% in 2016    Past Surgical History:  Procedure Laterality Date   BIOPSY  05/02/2022   Procedure: BIOPSY;  Surgeon: 07/02/2022, MD;  [facility];  Service: Endoscopy;;   CARDIAC CATHETERIZATION     CHOLECYSTECTOMY     CORONARY STENT PLACEMENT  07/15/12   ESOPHAGOGASTRODUODENOSCOPY (EGD) WITH PROPOFOL N/A 05/02/2022   Procedure: ESOPHAGOGASTRODUODENOSCOPY (EGD) WITH PROPOFOL;  Surgeon: 07/02/2022, MD;  [facility];  Service: Endoscopy;  Laterality: N/A;  1000   KNEE SURGERY     LEFT HEART CATHETERIZATION WITH CORONARY ANGIOGRAM N/A 07/13/2012   Procedure: LEFT HEART CATHETERIZATION WITH CORONARY ANGIOGRAM;  Surgeon: 07/15/2012, MD;  Location: [facility] CATH LAB;  Service: Cardiovascular;  Laterality: N/A;   LEFT HEART CATHETERIZATION WITH CORONARY ANGIOGRAM N/A 11/20/2012   Procedure: LEFT HEART CATHETERIZATION WITH CORONARY ANGIOGRAM;  Surgeon: 11/22/2012, MD;  Location: [facility] CATH LAB;  Service: Cardiovascular;  Laterality: N/A;   LEFT HEART CATHETERIZATION WITH CORONARY ANGIOGRAM N/A 12/30/2014   Procedure: LEFT HEART CATHETERIZATION WITH CORONARY ANGIOGRAM;  Surgeon: Lennette Bihari, MD;  Location: [facility] CATH LAB;  Service: Cardiovascular;  Laterality: N/A;   PERCUTANEOUS CORONARY STENT INTERVENTION (PCI-S)  07/13/2012   Procedure: PERCUTANEOUS CORONARY STENT INTERVENTION (PCI-S);  Surgeon: Marykay Lex, MD;  Location: [facility] CATH LAB;  Service: Cardiovascular;;   PERCUTANEOUS CORONARY STENT INTERVENTION (PCI-S)  12/30/2014   Procedure: PERCUTANEOUS CORONARY STENT INTERVENTION (PCI-S);  Surgeon: Lennette Bihari, MD;   Location: [facility] CATH LAB;  Service: Cardiovascular;;    Current Outpatient Medications  Medication Sig Dispense Refill   acetaminophen (TYLENOL) 650 MG CR tablet Take 1,300 mg by mouth every 8 (eight) hours as needed for pain.     Aspirin-Acetaminophen-Caffeine (GOODY HEADACHE PO) Take 1 packet by mouth daily as needed (pain).     benazepril-hydrochlorthiazide (LOTENSIN HCT) 20-25 MG per tablet Take 1 tablet by mouth daily. <please make appointment with cardiologist for refills> 30 tablet 1   brimonidine (ALPHAGAN P) 0.1 % SOLN Place 2 drops into the left eye daily as needed (irritation).     carvedilol (COREG) 6.25 MG tablet Take 1 tablet (6.25 mg total) by mouth 2 (two) times daily. 180 tablet 1   cetirizine (ZYRTEC) 10 MG tablet Take 10 mg by mouth daily.     cholecalciferol (VITAMIN D3) 25 MCG (1000 UNIT) tablet Take 1,000 Units by mouth daily.     diazepam (VALIUM) 5 MG tablet Take 5 mg by mouth at bedtime as needed (for sleep).      Docusate Sodium (COLACE PO) Take 1 tablet by mouth daily.     nitroGLYCERIN (NITROSTAT) 0.4 MG SL tablet PLACE ONE TABLET UNDER THE TONGUE EVERY FIVE MINUTES FOR THREE DOSES AS NEEDED 25 tablet 3   pantoprazole (PROTONIX) 40 MG tablet Take 1 tablet (40 mg total) by mouth daily before breakfast. 30 tablet 5   potassium chloride (KLOR-CON) 10 MEQ tablet Take 10 mEq by mouth daily.     rosuvastatin (CRESTOR) 10 MG tablet Take 10 mg by mouth daily.     Simethicone (GAS-X PO) Take 2 tablets by mouth daily as needed (gas).     aspirin EC 81 MG tablet Take 1 tablet (81 mg total) by mouth daily. (Patient not taking: Reported on 08/08/2022) 30 tablet 11   Calcium Carbonate Antacid (MAALOX PO) Take 1 Dose by mouth daily as needed (indigestion). (Patient not taking: Reported on 08/08/2022)     No current facility-administered medications for this visit.    Allergies as of 08/08/2022 - Review Complete 08/08/2022  Allergen Reaction Noted   Lipitor [atorvastatin] Other (See  Comments) 07/14/2012    Family History  Problem Relation Age of Onset   Hypertension Mother    Hypertension Father    Hypertension Sister    Hypertension Brother    Coronary artery disease Brother 60    Social History   Socioeconomic History   Marital status: Married    Spouse name: Not on file   Number of children: Not on file   Years of education: Not on file   Highest education level: Not on file  Occupational History   Not on file  Tobacco Use   Smoking status: Every Day    Packs/day: 0.25    Years: 20.00    Total pack years: 5.00    Types: Cigarettes    Start date: 11/23/1976    Passive exposure: Current   Smokeless tobacco: Never   Tobacco comments:    2 ciggs per day  Vaping Use   Vaping Use: Never  used  Substance and Sexual Activity   Alcohol use: No    Alcohol/week: 0.0 standard drinks of alcohol   Drug use: Yes    Types: Marijuana   Sexual activity: Yes  Other Topics Concern   Not on file  Social History Narrative   Not on file   Social Determinants of Health   Financial Resource Strain: Not on file  Food Insecurity: Not on file  Transportation Needs: Not on file  Physical Activity: Not on file  Stress: Not on file  Social Connections: Not on file   Review of systems General: negative for malaise, night sweats, fever, chills, weight loss Neck: Negative for lumps, goiter, pain and significant neck swelling Resp: Negative for cough, wheezing, dyspnea at rest CV: Negative for chest pain, leg swelling, palpitations, orthopnea GI: denies melena, hematochezia, nausea, vomiting, diarrhea, constipation, dysphagia, odyonophagia, early satiety or unintentional weight loss.  MSK: Negative for joint pain or swelling, back pain, and muscle pain. Derm: Negative for itching or rash Psych: Denies depression, anxiety, memory loss, confusion. No homicidal or suicidal ideation.  Heme: Negative for prolonged bleeding, bruising easily, and swollen  nodes. Endocrine: Negative for cold or heat intolerance, polyuria, polydipsia and goiter. Neuro: negative for tremor, gait imbalance, syncope and seizures. The remainder of the review of systems is noncontributory.  Physical Exam: BP 129/85 (BP Location: Left Arm, Patient Position: Sitting, Cuff Size: Large)   Pulse 61   Temp (!) 97.4 F (36.3 C) (Oral)   Ht 5\' 7"  (1.702 m)   Wt 190 lb 1.6 oz (86.2 kg)   BMI 29.77 kg/m  General:   Alert and oriented. No distress noted. Pleasant and cooperative.  Head:  Normocephalic and atraumatic. Eyes:  Conjuctiva clear without scleral icterus. Mouth:  Oral mucosa pink and moist. Good dentition. No lesions. Heart: Normal rate and rhythm, s1 and s2 heart sounds present.  Lungs: Clear lung sounds in all lobes. Respirations equal and unlabored. Abdomen:  +BS, soft, non-tender and non-distended. No rebound or guarding. No HSM or masses noted. Derm: No palmar erythema or jaundice Msk:  Symmetrical without gross deformities. Normal posture. Extremities:  Without edema. Neurologic:  Alert and  oriented x4 Psych:  Alert and cooperative. Normal mood and affect.  Invalid input(s): "6 MONTHS"   ASSESSMENT: [name] is a 66 y.o. female presenting today for follow up of GERD  Doing well on PRN PPI with lifestyle modifications and some dietary changes. She has minimal symptoms at this time. No red flag symptoms. Patient denies melena, hematochezia, nausea, vomiting, diarrhea, constipation, dysphagia, odyonophagia, early satiety or weight loss. Should continue with reflux precautions and using PPI as needed with good result. Counseled on continuing to use NSAIDs only as needed on infrequent basis.  PLAN:  Continue reflux precautions and avoiding trigger foods  2. Avoid frequent NSAID use 3. Continue PPI as needed with good results 4. Pt to make me aware of where last colonoscopy was done or if she has records of this so we can update chart  All  questions were answered, patient verbalized understanding and is in agreement with plan as outlined above.   Follow Up: 1 year  Rithik Odea L. 66, MSN, APRN, AGNP-C Adult-Gerontology Nurse Practitioner [facility]

## 2022-10-12 ENCOUNTER — Other Ambulatory Visit: Payer: Self-pay | Admitting: Physician Assistant

## 2022-11-20 ENCOUNTER — Ambulatory Visit: Payer: Medicare HMO | Admitting: Student

## 2023-01-15 ENCOUNTER — Encounter: Payer: Self-pay | Admitting: Medical

## 2023-01-15 ENCOUNTER — Ambulatory Visit: Payer: Medicare HMO | Attending: Medical | Admitting: Medical

## 2023-01-15 VITALS — BP 110/72 | HR 74 | Ht 67.0 in | Wt 188.0 lb

## 2023-01-15 DIAGNOSIS — I10 Essential (primary) hypertension: Secondary | ICD-10-CM

## 2023-01-15 DIAGNOSIS — Z8679 Personal history of other diseases of the circulatory system: Secondary | ICD-10-CM

## 2023-01-15 DIAGNOSIS — E782 Mixed hyperlipidemia: Secondary | ICD-10-CM

## 2023-01-15 DIAGNOSIS — I2510 Atherosclerotic heart disease of native coronary artery without angina pectoris: Secondary | ICD-10-CM | POA: Diagnosis not present

## 2023-01-15 DIAGNOSIS — I5022 Chronic systolic (congestive) heart failure: Secondary | ICD-10-CM | POA: Diagnosis not present

## 2023-01-15 NOTE — Patient Instructions (Signed)
Medication Instructions:  Your physician recommends that you continue on your current medications as directed. Please refer to the Current Medication list given to you today.   Labwork: None today  Testing/Procedures: None today  Follow-Up: 1 year  Any Other Special Instructions Will Be Listed Below (If Applicable).  If you need a refill on your cardiac medications before your next appointment, please call your pharmacy.  

## 2023-01-15 NOTE — Progress Notes (Signed)
Cardiology Office Note:    Date:  01/15/2023   ID:  [name], DOB [birth year], MRN [id number]  PCP:  Doris Andrea, MD  [facility] Cardiologist:  None  [facility] Electrophysiologist:  None   Referring MD: Doris Andrea, MD   Chief Complaint: 1 year follow-up  History of Present Illness:    [name] is a 67 y.o. female with a hx of CAD (s/p stenting of the proximal to mid LAD and PTCA of jailed diagonal branch in 2013, STEMI in 12/2014 with DES to distal RCA), ischemic cardiomyopathy (EF 50-55% in 2016), HTN, HLD and tobacco use who presents to the office today for 1 year follow-up.  Has a history of CAD status post tandem stenting of her proximal LAD in 06/2012 with a rescue PTCA of a jailed diagonal branch.  EF was as low as 40%.  Inferior STEMI [name] 2016 treated with DES to the distal RCA, LVEF 50 to 55% with mid inferior wall hypokinesis.  Last seen January 2023 reported chest pain in the setting of stress.  Today, the patient reports she is doing OK. She had a recent sinus infection and is recovering from that. She denies chest pain, SOB, lower leg edema, orthopnea or pnd. Patient lives with her step son. She can peform ADLs. She does work some as a Set designer. She does a lot of walking. She said ASA was stopped by ?GI a year ago. She is trying to eat healthy. She is on potassium for low K, being followed closely by PCP.  Past Medical History:  Diagnosis Date   Arthritis    Coronary artery disease    a. s/p stenting of the proximal to mid LAD and PTCA of jailed diagonal branch in 2013 b. STEMI in 12/2014 with DES to distal RCA   Hypertension    Ischemic cardiomyopathy    a. EF 40-45% in 2013 b. EF 55% in 2016    Past Surgical History:  Procedure Laterality Date   BIOPSY  05/02/2022   Procedure: BIOPSY;  Surgeon: Doris Houston, MD;  [facility];  Service: Endoscopy;;   CARDIAC CATHETERIZATION     CHOLECYSTECTOMY     CORONARY STENT  PLACEMENT  07/15/12   ESOPHAGOGASTRODUODENOSCOPY (EGD) WITH PROPOFOL N/A 05/02/2022   Procedure: ESOPHAGOGASTRODUODENOSCOPY (EGD) WITH PROPOFOL;  Surgeon: Doris Houston, MD;  [facility];  Service: Endoscopy;  Laterality: N/A;  1000   KNEE SURGERY     LEFT HEART CATHETERIZATION WITH CORONARY ANGIOGRAM N/A 07/13/2012   Procedure: LEFT HEART CATHETERIZATION WITH CORONARY ANGIOGRAM;  Surgeon: Doris Man, MD;  Location: [facility] CATH LAB;  Service: Cardiovascular;  Laterality: N/A;   LEFT HEART CATHETERIZATION WITH CORONARY ANGIOGRAM N/A 11/20/2012   Procedure: LEFT HEART CATHETERIZATION WITH CORONARY ANGIOGRAM;  Surgeon: Doris Man, MD;  Location: [facility] CATH LAB;  Service: Cardiovascular;  Laterality: N/A;   LEFT HEART CATHETERIZATION WITH CORONARY ANGIOGRAM N/A 12/30/2014   Procedure: LEFT HEART CATHETERIZATION WITH CORONARY ANGIOGRAM;  Surgeon: Doris Sine, MD;  Location: [facility] CATH LAB;  Service: Cardiovascular;  Laterality: N/A;   PERCUTANEOUS CORONARY STENT INTERVENTION (PCI-S)  07/13/2012   Procedure: PERCUTANEOUS CORONARY STENT INTERVENTION (PCI-S);  Surgeon: Doris Man, MD;  Location: [facility] CATH LAB;  Service: Cardiovascular;;   PERCUTANEOUS CORONARY STENT INTERVENTION (PCI-S)  12/30/2014   Procedure: PERCUTANEOUS CORONARY STENT INTERVENTION (PCI-S);  Surgeon: Doris Sine, MD;  Location: [facility] CATH LAB;  Service: Cardiovascular;;    Current Medications:  Current Meds  Medication Sig   acetaminophen (TYLENOL) 650 MG CR tablet Take 1,300 mg by mouth every 8 (eight) hours as needed for pain.   benazepril-hydrochlorthiazide (LOTENSIN HCT) 20-25 MG per tablet Take 1 tablet by mouth daily. <please make appointment with cardiologist for refills>   brimonidine (ALPHAGAN P) 0.1 % SOLN Place 2 drops into the left eye daily as needed (irritation).   carvedilol (COREG) 6.25 MG tablet Take 1 tablet (6.25 mg total) by mouth 2 (two) times daily with a meal.   cholecalciferol (VITAMIN D3) 25 MCG  (1000 UNIT) tablet Take 1,000 Units by mouth daily.   diazepam (VALIUM) 5 MG tablet Take 5 mg by mouth at bedtime as needed (for sleep).    Docusate Sodium (COLACE PO) Take 1 tablet by mouth daily.   fexofenadine-pseudoephedrine (ALLEGRA-D 24) 180-240 MG 24 hr tablet Take 1 tablet by mouth daily.   nitroGLYCERIN (NITROSTAT) 0.4 MG SL tablet PLACE ONE TABLET UNDER THE TONGUE EVERY FIVE MINUTES FOR THREE DOSES AS NEEDED   pantoprazole (PROTONIX) 40 MG tablet Take 1 tablet (40 mg total) by mouth daily before breakfast.   potassium chloride (KLOR-CON) 10 MEQ tablet Take 10 mEq by mouth daily.   rosuvastatin (CRESTOR) 10 MG tablet Take 10 mg by mouth daily.   Simethicone (GAS-X PO) Take 2 tablets by mouth daily as needed (gas).   [DISCONTINUED] cetirizine (ZYRTEC) 10 MG tablet Take 10 mg by mouth daily.     Allergies:   Lipitor [atorvastatin]   Social History   Socioeconomic History   Marital status: Married    Spouse name: Not on file   Number of children: Not on file   Years of education: Not on file   Highest education level: Not on file  Occupational History   Not on file  Tobacco Use   Smoking status: Every Day    Packs/day: 0.25    Years: 20.00    Total pack years: 5.00    Types: Cigarettes    Start date: 11/23/1976    Passive exposure: Current   Smokeless tobacco: Never   Tobacco comments:    2 ciggs per day  Vaping Use   Vaping Use: Never used  Substance and Sexual Activity   Alcohol use: No    Alcohol/week: 0.0 standard drinks of alcohol   Drug use: Yes    Types: Marijuana   Sexual activity: Yes  Other Topics Concern   Not on file  Social History Narrative   Not on file   Social Determinants of Health   Financial Resource Strain: Not on file  Food Insecurity: Not on file  Transportation Needs: Not on file  Physical Activity: Not on file  Stress: Not on file  Social Connections: Not on file     Family History: The patient's family history includes  Coronary artery disease (age of onset: 68) in her brother; Hypertension in her brother, father, mother, and sister.  ROS:   Please see the history of present illness.     All other systems reviewed and are negative.  EKGs/Labs/Other Studies Reviewed:    The following studies were reviewed today:  No recent  EKG:  EKG is ordered today.  The ekg ordered today demonstrates NSR 68bpm, TWI inferolateral leads  Recent Labs: 03/15/2022: ALT 16; Hemoglobin 12.0; Platelets 255 04/30/2022: BUN 8; Creatinine, Ser 0.76; Potassium 3.3; Sodium 135  Recent Lipid Panel    Component Value Date/Time   CHOL 165 07/13/2012 0505   TRIG 43 07/13/2012  0505   HDL 68 07/13/2012 0505   CHOLHDL 2.4 07/13/2012 0505   VLDL 9 07/13/2012 0505   LDLCALC 88 07/13/2012 0505    Physical Exam:    VS:  BP 110/72   Pulse 74   Ht 5\' 7"  (1.702 m)   Wt 188 lb (85.3 kg)   SpO2 97%   BMI 29.44 kg/m     Wt Readings from Last 3 Encounters:  01/15/23 188 lb (85.3 kg)  08/08/22 190 lb 1.6 oz (86.2 kg)  05/02/22 192 lb (87.1 kg)     GEN:  Well nourished, well developed in no acute distress HEENT: Normal NECK: No JVD; No carotid bruits LYMPHATICS: No lymphadenopathy CARDIAC: RRR, no murmurs, rubs, gallops RESPIRATORY:  Clear to auscultation without rales, wheezing or rhonchi  ABDOMEN: Soft, non-tender, non-distended MUSCULOSKELETAL:  No edema; No deformity  SKIN: Warm and dry NEUROLOGIC:  Alert and oriented x 3 PSYCHIATRIC:  Normal affect   ASSESSMENT:    1. Coronary artery disease involving native coronary artery of native heart without angina pectoris   2. History of cardiomyopathy   3. Chronic systolic heart failure (HCC)   4. Essential hypertension   5. Hyperlipidemia, mixed    PLAN:    In order of problems listed above:  CAD  CAD s/p stenting to p-m LAD and PTCA of jailed diagonal branch I 2013 and STEMI in 12/2014 with DES to distal RCA. Patient reports ?GI stopped her ASA about a year ago. I  recommended she restart ASA, can be EC if she is having GI issues. She is taking Protonix. Continue Coreg and Crestor. No chest pain reported. Patient stays fairly active. No further ischemic work-up at this time.   H/o ICM HFimpEF EF was previously 40%, improved to 50-55% in 2016. She is euvolemic on exam. Continue Coreg and Benazepril-HCTZ 20-25mg  daily.   HTN BP is good today, continue current medications.   HLD PCP will update labs. Continue Crestor 10mg  daily.   Disposition: Follow up in 1 year(s) with MD/APP    Signed, Maxx Pham 2017, PA-C  01/15/2023 4:03 PM    [facility]

## 2023-02-12 ENCOUNTER — Ambulatory Visit: Payer: Medicare HMO | Admitting: Medical

## 2023-03-12 ENCOUNTER — Telehealth: Payer: Self-pay | Admitting: Student

## 2023-03-12 ENCOUNTER — Other Ambulatory Visit: Payer: Self-pay | Admitting: Student

## 2023-03-12 MED ORDER — NITROGLYCERIN 0.4 MG SL SUBL: SUBLINGUAL_TABLET | SUBLINGUAL | 3 refills | Status: AC

## 2023-03-12 NOTE — Telephone Encounter (Signed)
*  STAT* If patient is at the pharmacy, call can be transferred to refill team.   1. Which medications need to be refilled? (please list name of each medication and dose if known) carvedilol (COREG) 6.25 MG tablet TAKE ONE TABLET (6.25MG  TOTAL) BY MOUTH TWO TIMES DAILY  nitroGLYCERIN (NITROSTAT) 0.4 MG SL tablet  PLACE ONE TABLET UNDER THE TONGUE EVERY FIVE MINUTES FOR THREE DOSES AS NEEDED   2. Which pharmacy/location (including street and city if local pharmacy) is medication to be sent to? [place]   3. Do they need a 30 day or 90 day supply? 90 Day Supply  Pt is running low on Carvedilol and will need a refill before this friday

## 2023-03-12 NOTE — Telephone Encounter (Signed)
Completed.

## 2023-05-14 ENCOUNTER — Other Ambulatory Visit (HOSPITAL_COMMUNITY): Payer: Self-pay | Admitting: Nurse Practitioner

## 2023-05-14 DIAGNOSIS — Z1231 Encounter for screening mammogram for malignant neoplasm of breast: Secondary | ICD-10-CM

## 2023-05-21 ENCOUNTER — Ambulatory Visit (HOSPITAL_COMMUNITY)
Admission: RE | Admit: 2023-05-21 | Discharge: 2023-05-21 | Disposition: A | Discharge status: Home / Self Care | Payer: 59 | Source: Ambulatory Visit | Attending: Nurse Practitioner | Admitting: Nurse Practitioner

## 2023-05-21 ENCOUNTER — Encounter (HOSPITAL_COMMUNITY): Payer: Self-pay

## 2023-05-21 DIAGNOSIS — Z1231 Encounter for screening mammogram for malignant neoplasm of breast: Secondary | ICD-10-CM | POA: Diagnosis present

## 2023-07-10 NOTE — Progress Notes (Signed)
[facility] [address] [place] [address]   Clinic Day:  07/11/23   Referring physician: John Giovanni, MD  Patient Care Team: John Giovanni, MD as PCP - General (Family Medicine) Doreatha Massed, MD as Medical Oncologist (Hematology)   ASSESSMENT & PLAN:   Assessment:  1.  Leukopenia and neutropenia: - Patient seen at the request of Dr. Sudie Bailey. - 05/17/2023: WBC-3.9, Hb-12.3, PLT-261, MCV-86, 16% N, 71% L, 7% M, 3% ED, ANC 659. - 03/26/2022: WBC-3.9, WNU-272, Hb-12.1, PLT-256. - 04/24/2021: WBC-4.6, ZDG-6440, N-30%, L-58%, M-7%, E-2% - 04/29/2019: WBC-4.4, 36% N, 53% L, 6% M, ANC 1.6 - She has minor cold 1-2 times per year.  Last sinus infection 1 month ago.  No B symptoms.  Occasional hot flashes. - Only new medication pantoprazole approximately started 2 years ago.  2.  Social/family history: - She works part-time as a Research officer, political party.  Previously worked as a Merchandiser, retail in a Company secretary.  No chemical exposure.  Current active smoker, half pack per day started at age 35. - Mother had "pelvis cancer".  Sister also died of cancer.  Plan:  1.  Leukopenia and neutropenia: - Discussed different etiologies of leukopenia and neutropenia. - Also discussed benign ethnic neutropenia and recommended blood typing for Duffy antigen. - Will check for nutritional deficiencies, connective tissue disorders, infectious etiologies, and bone marrow infiltrative process. - RTC 3 to 4 weeks for follow-up.   Orders Placed This Encounter  Procedures   CBC with Differential    Standing Status:   Future    Number of Occurrences:   1    Standing Expiration Date:   07/10/2024   Lactate dehydrogenase    Standing Status:   Future    Number of Occurrences:   1    Standing Expiration Date:   07/10/2024   Reticulocytes    Standing Status:   Future    Number of Occurrences:   1    Standing Expiration Date:   07/10/2024    Vitamin B12    Standing Status:   Future    Number of Occurrences:   1    Standing Expiration Date:   07/10/2024   Folate    Standing Status:   Future    Number of Occurrences:   1    Standing Expiration Date:   07/10/2024   Methylmalonic acid, serum    Standing Status:   Future    Number of Occurrences:   1    Standing Expiration Date:   07/10/2024   Copper, serum    Standing Status:   Future    Number of Occurrences:   1    Standing Expiration Date:   07/10/2024   Protein electrophoresis, serum    Standing Status:   Future    Number of Occurrences:   1    Standing Expiration Date:   07/10/2024   Sedimentation rate    Standing Status:   Future    Number of Occurrences:   1    Standing Expiration Date:   07/10/2024   C-reactive protein    Standing Status:   Future    Number of Occurrences:   1    Standing Expiration Date:   07/10/2024   ANA, IFA (with reflex)    Standing Status:   Future    Number of Occurrences:   1    Standing Expiration Date:   07/10/2024   Rheumatoid factor  Standing Status:   Future    Number of Occurrences:   1    Standing Expiration Date:   07/10/2024   Hepatitis B surface antigen    Standing Status:   Future    Number of Occurrences:   1    Standing Expiration Date:   07/10/2024   Hepatitis B surface antibody    Standing Status:   Future    Number of Occurrences:   1    Standing Expiration Date:   07/10/2024   Hepatitis B core antibody, total    Standing Status:   Future    Number of Occurrences:   1    Standing Expiration Date:   07/10/2024   Hepatitis C Antibody    Standing Status:   Future    Number of Occurrences:   1    Standing Expiration Date:   07/10/2024   HIV antibody (with reflex)    Standing Status:   Future    Number of Occurrences:   1    Standing Expiration Date:   07/10/2024   Miscellaneous [facility] test (send-out)    Standing Status:   Future    Number of Occurrences:   1    Standing Expiration Date:   07/10/2024    Order  Specific Question:   Test name / description:    Answer:   RBC Antigen Typing: Fya/Fyb Test #: [id number]    Order Specific Question:   Release to patient    Answer:   Immediate      I,Helena R Teague,acting as a scribe for Doreatha Massed, MD.,have documented all relevant documentation on the behalf of Doreatha Massed, MD,as directed by  Doreatha Massed, MD while in the presence of Doreatha Massed, MD.   I, Doreatha Massed MD, have reviewed the above documentation for accuracy and completeness, and I agree with the above.   Doreatha Massed, MD   7/12/20244:35 PM  CHIEF COMPLAINT/PURPOSE OF CONSULT:   Diagnosis: low level of neutrophil  Current Therapy:  none  HISTORY OF PRESENT ILLNESS:   [name] is a 67 y.o. female presenting to clinic today for evaluation of low level neutrophil at the request of Dr. John Giovanni.  Patient's last labs on 06/24/23 showed absolute neutrophils at 659, and on 03/26/22 showed absolute neutrophils at 862.   Today, she states that she is doing well overall. Her appetite level is at 75%. Her energy level is at 80%.  She notes that she has sickle cell trait. She reports she had a hernia within the last 4 years.   She notes she has been taking Protonix for 2 years, Vitamin D 1x daily, and OTC Tylenol prn. She used to take iron but stopped because it made her feel "off balanced" and Vitamin B12. She reports she has decreased her intake of meat, occasionally eating chicken and hamburgers.   She reports a family history of rheumatoid arthritis in all of her sisters. She also has rheumatoid arthritis in her bilateral knees.  She notes she has had sinus infections 1x a year and cold 1-2x a year over the last 4 years. She is prescribed antibiotics for bad sinus infections, the last time she has taken antibiotics was 1 month ago.   She denies pneumonia, fever, night sweats, significant weight loss, skin rashes when out in the sun. She  reports that she is staying active and walking daily.   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 She denies any direct exposure to chemicals. She notes that she smokes 1/2 ppd for 32 years. She denies any family history of frequent infections and blood problems. She notes her mother had pelvis cancer and her sister died from a cancer the patient is unaware of.   PAST MEDICAL HISTORY:   Past Medical History: Past Medical History:  Diagnosis Date   Arthritis    Coronary artery disease    a. s/p stenting of the proximal to mid LAD and PTCA of jailed diagonal branch in 2013 b. STEMI in 12/2014 with DES to distal RCA   Hypertension    Ischemic cardiomyopathy    a. EF 40-45% in 2013 b. EF 55% in 2016    Surgical History: Past Surgical History:  Procedure Laterality Date   BIOPSY  05/02/2022   Procedure: BIOPSY;  Surgeon: Malissa Hippo, MD;  Location: [facility];  Service: Endoscopy;;   CARDIAC CATHETERIZATION     CHOLECYSTECTOMY     CORONARY STENT PLACEMENT  07/15/12   ESOPHAGOGASTRODUODENOSCOPY (EGD) WITH PROPOFOL N/A 05/02/2022   Procedure: ESOPHAGOGASTRODUODENOSCOPY (EGD) WITH PROPOFOL;  Surgeon: Malissa Hippo, MD;  Location: [facility];  Service: Endoscopy;  Laterality: N/A;  1000   KNEE SURGERY     LEFT HEART CATHETERIZATION WITH CORONARY ANGIOGRAM N/A 07/13/2012   Procedure: LEFT HEART CATHETERIZATION WITH CORONARY ANGIOGRAM;  Surgeon: Marykay Lex, MD;  Location: [facility] CATH LAB;  Service: Cardiovascular;  Laterality: N/A;   LEFT HEART CATHETERIZATION WITH CORONARY ANGIOGRAM N/A 11/20/2012   Procedure: LEFT HEART CATHETERIZATION WITH CORONARY ANGIOGRAM;  Surgeon: Marykay Lex, MD;  Location: [facility] CATH LAB;  Service: Cardiovascular;  Laterality: N/A;   LEFT HEART CATHETERIZATION WITH CORONARY ANGIOGRAM N/A 12/30/2014   Procedure: LEFT HEART CATHETERIZATION WITH CORONARY ANGIOGRAM;  Surgeon: Lennette Bihari, MD;   Location: [facility] CATH LAB;  Service: Cardiovascular;  Laterality: N/A;   PERCUTANEOUS CORONARY STENT INTERVENTION (PCI-S)  07/13/2012   Procedure: PERCUTANEOUS CORONARY STENT INTERVENTION (PCI-S);  Surgeon: Marykay Lex, MD;  Location: [facility] CATH LAB;  Service: Cardiovascular;;   PERCUTANEOUS CORONARY STENT INTERVENTION (PCI-S)  12/30/2014   Procedure: PERCUTANEOUS CORONARY STENT INTERVENTION (PCI-S);  Surgeon: Lennette Bihari, MD;  Location: [facility] CATH LAB;  Service: Cardiovascular;;    Social History: Social History   Socioeconomic History   Marital status: Married    Spouse name: Not on file   Number of children: Not on file   Years of education: Not on file   Highest education level: Not on file  Occupational History   Not on file  Tobacco Use   Smoking status: Every Day    Current packs/day: 0.25    Average packs/day: 0.3 packs/day for 46.6 years (11.7 ttl pk-yrs)    Types: Cigarettes    Start date: 11/23/1976    Passive exposure: Current   Smokeless tobacco: Never   Tobacco comments:    2 ciggs per day  Vaping Use   Vaping status: Never Used  Substance and Sexual Activity   Alcohol use: No    Alcohol/week: 0.0 standard drinks of alcohol   Drug use: Yes    Types: Marijuana   Sexual activity: Yes  Other Topics Concern   Not on file  Social History Narrative   Not on file   Social Determinants of Health   Financial Resource Strain: Not on file  Food Insecurity: Not on file  Transportation Needs: Not on file  Physical Activity: Not on  file  Stress: Not on file  Social Connections: Not on file  Intimate Partner Violence: Not on file    Family History: Family History  Problem Relation Age of Onset   Hypertension Mother    Hypertension Father    Hypertension Sister    Hypertension Brother    Coronary artery disease Brother 74    Current Medications:  Current Outpatient Medications:    acetaminophen (TYLENOL) 650 MG CR tablet, Take 1,300 mg by mouth every 8 (eight)  hours as needed for pain., Disp: , Rfl:    benazepril-hydrochlorthiazide (LOTENSIN HCT) 20-25 MG per tablet, Take 1 tablet by mouth daily. <please make appointment with cardiologist for refills>, Disp: 30 tablet, Rfl: 1   brimonidine (ALPHAGAN P) 0.1 % SOLN, Place 2 drops into the left eye daily as needed (irritation)., Disp: , Rfl:    carvedilol (COREG) 6.25 MG tablet, TAKE ONE TABLET (6.25MG  TOTAL) BY MOUTH TWO TIMES DAILY, Disp: 120 tablet, Rfl: 2   cholecalciferol (VITAMIN D3) 25 MCG (1000 UNIT) tablet, Take 1,000 Units by mouth daily., Disp: , Rfl:    diazepam (VALIUM) 5 MG tablet, Take 5 mg by mouth at bedtime as needed (for sleep). , Disp: , Rfl:    Docusate Sodium (COLACE PO), Take 1 tablet by mouth daily., Disp: , Rfl:    fexofenadine-pseudoephedrine (ALLEGRA-D 24) 180-240 MG 24 hr tablet, Take 1 tablet by mouth daily., Disp: , Rfl:    nitroGLYCERIN (NITROSTAT) 0.4 MG SL tablet, PLACE ONE TABLET UNDER THE TONGUE EVERY FIVE MINUTES FOR THREE DOSES AS NEEDED, Disp: 25 tablet, Rfl: 3   pantoprazole (PROTONIX) 40 MG tablet, Take 1 tablet (40 mg total) by mouth daily before breakfast., Disp: 30 tablet, Rfl: 5   potassium chloride (KLOR-CON) 10 MEQ tablet, Take 10 mEq by mouth daily., Disp: , Rfl:    rosuvastatin (CRESTOR) 10 MG tablet, Take 10 mg by mouth daily., Disp: , Rfl:    Simethicone (GAS-X PO), Take 2 tablets by mouth daily as needed (gas)., Disp: , Rfl:    Allergies: Allergies  Allergen Reactions   Lipitor [Atorvastatin] Other (See Comments)    Severe bone aches    REVIEW OF SYSTEMS:   Review of Systems  Constitutional:  Negative for chills, fatigue and fever.  HENT:   Negative for lump/mass, mouth sores, nosebleeds, sore throat and trouble swallowing.   Eyes:  Negative for eye problems.  Respiratory:  Negative for cough and shortness of breath.   Cardiovascular:  Negative for chest pain, leg swelling and palpitations.  Gastrointestinal:  Negative for abdominal pain,  constipation, diarrhea, nausea and vomiting.  Genitourinary:  Negative for bladder incontinence, difficulty urinating, dysuria, frequency, hematuria and nocturia.   Musculoskeletal:  Negative for arthralgias, back pain, flank pain, myalgias and neck pain.  Skin:  Negative for itching and rash.  Neurological:  Negative for dizziness, headaches and numbness.  Hematological:  Does not bruise/bleed easily.  Psychiatric/Behavioral:  Positive for depression. Negative for sleep disturbance and suicidal ideas. The patient is nervous/anxious.   All other systems reviewed and are negative.    VITALS:   Blood pressure 111/81, pulse 71, temperature 98.4 F (36.9 C), temperature source Oral, resp. rate 17, height 5\' 8"  (1.727 m), weight 191 lb 6.4 oz (86.8 kg), SpO2 100%.  Wt Readings from Last 3 Encounters:  07/11/23 191 lb 6.4 oz (86.8 kg)  01/15/23 188 lb (85.3 kg)  08/08/22 190 lb 1.6 oz (86.2 kg)    Body mass index is 29.1 kg/m.  PHYSICAL EXAM:   Physical Exam Vitals and nursing note reviewed. Exam conducted with a chaperone present.  Constitutional:      Appearance: Normal appearance.  Cardiovascular:     Rate and Rhythm: Normal rate and regular rhythm.     Pulses: Normal pulses.     Heart sounds: Normal heart sounds.  Pulmonary:     Effort: Pulmonary effort is normal.     Breath sounds: Normal breath sounds.  Abdominal:     Palpations: Abdomen is soft. There is no hepatomegaly, splenomegaly or mass.     Tenderness: There is no abdominal tenderness.  Musculoskeletal:     Right lower leg: No edema.     Left lower leg: No edema.  Lymphadenopathy:     Cervical: No cervical adenopathy.     Right cervical: No superficial, deep or posterior cervical adenopathy.    Left cervical: No superficial, deep or posterior cervical adenopathy.     Upper Body:     Right upper body: No supraclavicular or axillary adenopathy.     Left upper body: No supraclavicular or axillary adenopathy.   Neurological:     General: No focal deficit present.     Mental Status: She is alert and oriented to person, place, and time.  Psychiatric:        Mood and Affect: Mood normal.        Behavior: Behavior normal.     LABS:      Latest Ref Rng & Units 07/11/2023    8:59 AM 03/15/2022   11:55 AM 07/18/2017    5:13 PM  CBC  WBC 4.0 - 10.5 K/uL 4.4  4.2  4.7   Hemoglobin 12.0 - 15.0 g/dL 82.9  56.2  13.0   Hematocrit 36.0 - 46.0 % 38.9  38.0  37.9   Platelets 150 - 400 K/uL 275  255  258       Latest Ref Rng & Units 04/30/2022    8:18 AM 03/15/2022   11:55 AM 07/18/2017    5:13 PM  CMP  Glucose 70 - 99 mg/dL 98  95  865   BUN 8 - 23 mg/dL 8  11  11    Creatinine 0.44 - 1.00 mg/dL 7.84  6.96  2.95   Sodium 135 - 145 mmol/L 135  138  136   Potassium 3.5 - 5.1 mmol/L 3.3  3.5  2.8   Chloride 98 - 111 mmol/L 99  100  102   CO2 22 - 32 mmol/L 29  27  25    Calcium 8.9 - 10.3 mg/dL 9.7  9.4  9.0   Total Protein 6.5 - 8.1 g/dL  6.7  6.7   Total Bilirubin 0.3 - 1.2 mg/dL  0.3  0.5   Alkaline Phos 38 - 126 U/L  34  47   AST 15 - 41 U/L  18  15   ALT 0 - 44 U/L  16  14      No results found for: "CEA1", "CEA" / No results found for: "CEA1", "CEA" No results found for: "PSA1" No results found for: "MWU132" No results found for: "CAN125"  No results found for: "TOTALPROTELP", "ALBUMINELP", "A1GS", "A2GS", "BETS", "BETA2SER", "GAMS", "MSPIKE", "SPEI" No results found for: "TIBC", "FERRITIN", "IRONPCTSAT" Lab Results  Component Value Date   LDH 165 07/11/2023     STUDIES:   No results found.

## 2023-07-11 ENCOUNTER — Inpatient Hospital Stay: Payer: 59

## 2023-07-11 ENCOUNTER — Inpatient Hospital Stay: Payer: 59 | Attending: Hematology | Admitting: Hematology

## 2023-07-11 VITALS — BP 111/81 | HR 71 | Temp 98.4°F | Resp 17 | Ht 68.0 in | Wt 191.4 lb

## 2023-07-11 DIAGNOSIS — Z8249 Family history of ischemic heart disease and other diseases of the circulatory system: Secondary | ICD-10-CM | POA: Insufficient documentation

## 2023-07-11 DIAGNOSIS — M069 Rheumatoid arthritis, unspecified: Secondary | ICD-10-CM | POA: Insufficient documentation

## 2023-07-11 DIAGNOSIS — K219 Gastro-esophageal reflux disease without esophagitis: Secondary | ICD-10-CM | POA: Diagnosis not present

## 2023-07-11 DIAGNOSIS — M25561 Pain in right knee: Secondary | ICD-10-CM | POA: Diagnosis not present

## 2023-07-11 DIAGNOSIS — M25562 Pain in left knee: Secondary | ICD-10-CM | POA: Diagnosis not present

## 2023-07-11 DIAGNOSIS — F1290 Cannabis use, unspecified, uncomplicated: Secondary | ICD-10-CM | POA: Insufficient documentation

## 2023-07-11 DIAGNOSIS — I252 Old myocardial infarction: Secondary | ICD-10-CM | POA: Insufficient documentation

## 2023-07-11 DIAGNOSIS — Z79899 Other long term (current) drug therapy: Secondary | ICD-10-CM | POA: Insufficient documentation

## 2023-07-11 DIAGNOSIS — D709 Neutropenia, unspecified: Secondary | ICD-10-CM | POA: Insufficient documentation

## 2023-07-11 DIAGNOSIS — D708 Other neutropenia: Secondary | ICD-10-CM

## 2023-07-11 DIAGNOSIS — Z9049 Acquired absence of other specified parts of digestive tract: Secondary | ICD-10-CM | POA: Diagnosis not present

## 2023-07-11 DIAGNOSIS — F17210 Nicotine dependence, cigarettes, uncomplicated: Secondary | ICD-10-CM | POA: Insufficient documentation

## 2023-07-11 DIAGNOSIS — R232 Flushing: Secondary | ICD-10-CM | POA: Insufficient documentation

## 2023-07-11 DIAGNOSIS — D573 Sickle-cell trait: Secondary | ICD-10-CM | POA: Insufficient documentation

## 2023-07-11 LAB — CBC WITH DIFFERENTIAL/PLATELET
Abs Immature Granulocytes: 0 10*3/uL (ref 0.00–0.07)
Basophils Absolute: 0 10*3/uL (ref 0.0–0.1)
Basophils Relative: 0 %
Eosinophils Absolute: 0 10*3/uL (ref 0.0–0.5)
Eosinophils Relative: 1 %
HCT: 38.9 % (ref 36.0–46.0)
Hemoglobin: 12.4 g/dL (ref 12.0–15.0)
Lymphocytes Relative: 71 %
Lymphs Abs: 3.1 10*3/uL (ref 0.7–4.0)
MCH: 28.4 pg (ref 26.0–34.0)
MCHC: 31.9 g/dL (ref 30.0–36.0)
MCV: 89 fL (ref 80.0–100.0)
Monocytes Absolute: 0.1 10*3/uL (ref 0.1–1.0)
Monocytes Relative: 3 %
Neutro Abs: 1.1 10*3/uL — ABNORMAL LOW (ref 1.7–7.7)
Neutrophils Relative %: 25 %
Platelets: 275 10*3/uL (ref 150–400)
RBC: 4.37 MIL/uL (ref 3.87–5.11)
RDW: 15 % (ref 11.5–15.5)
WBC Morphology: REACTIVE
WBC: 4.4 10*3/uL (ref 4.0–10.5)
nRBC: 0 % (ref 0.0–0.2)

## 2023-07-11 LAB — RETICULOCYTES
Immature Retic Fract: 16.9 % — ABNORMAL HIGH (ref 2.3–15.9)
RBC.: 4.36 MIL/uL (ref 3.87–5.11)
Retic Count, Absolute: 84.1 10*3/uL (ref 19.0–186.0)
Retic Ct Pct: 1.9 % (ref 0.4–3.1)

## 2023-07-11 LAB — HIV ANTIBODY (ROUTINE TESTING W REFLEX): HIV Screen 4th Generation wRfx: NONREACTIVE

## 2023-07-11 LAB — LACTATE DEHYDROGENASE: LDH: 165 U/L (ref 98–192)

## 2023-07-11 LAB — FOLATE: Folate: 10.9 ng/mL (ref >5.9)

## 2023-07-11 LAB — HEPATITIS B SURFACE ANTIGEN: Hepatitis B Surface Ag: NONREACTIVE

## 2023-07-11 LAB — HEPATITIS B SURFACE ANTIBODY,QUALITATIVE: Hep B S Ab: NONREACTIVE

## 2023-07-11 LAB — HEPATITIS B CORE ANTIBODY, TOTAL: Hep B Core Total Ab: NONREACTIVE

## 2023-07-11 LAB — C-REACTIVE PROTEIN: CRP: <0.5 mg/dL (ref <1.0)

## 2023-07-11 LAB — HEPATITIS C ANTIBODY: HCV Ab: NONREACTIVE

## 2023-07-11 LAB — SEDIMENTATION RATE: Sed Rate: 11 mm/hr (ref 0–22)

## 2023-07-11 LAB — VITAMIN B12: Vitamin B-12: 420 pg/mL (ref 180–914)

## 2023-07-11 NOTE — Patient Instructions (Signed)
[facility]  Discharge Instructions  You were seen and examined today by Dr. Ellin Saba. Dr. Ellin Saba is a hematologist, meaning that he specializes in blood abnormalities. Dr. Ellin Saba discussed your past medical history, family history of cancers/blood conditions and the events that led to you being here today.  You were referred to Dr. Ellin Saba due to decreased white blood cell counts.  Dr. Ellin Saba has recommended additional labs today for further evaluation.  Follow-up as scheduled.  Thank you for choosing [facility] to provide your oncology and hematology care.   To afford each patient quality time with our provider, please arrive at least 15 minutes before your scheduled appointment time. You may need to reschedule your appointment if you arrive late (10 or more minutes). Arriving late affects you and other patients whose appointments are after yours.  Also, if you miss three or more appointments without notifying the office, you may be dismissed from the clinic at the provider's discretion.    Again, thank you for choosing [facility]  Our hope is that these requests will decrease the amount of time that you wait before being seen by our physicians.   If you have a lab appointment with the Cancer Center - please note that after April 8th, all labs will be drawn in the cancer center.  You do not have to check in or register with the main entrance as you have in the past but will complete your check-in at the cancer center.            _____________________________________________________________  Should you have questions after your visit to [facility], please contact our office at [phone] and follow the prompts.  Our office hours are 8:00 a.m. to 4:30 p.m. Monday - Thursday and 8:00 a.m. to 2:30 p.m. Friday.  Please note that voicemails left after 4:00 p.m. may not be returned until the  following business day.  We are closed weekends and all major holidays.  You do have access to a nurse 24-7, just call the main number to the clinic [phone] and do not press any options, hold on the line and a nurse will answer the phone.    For prescription refill requests, have your pharmacy contact our office and allow 72 hours.    Masks are no longer required in the cancer centers. If you would like for your care team to wear a mask while they are taking care of you, please let them know. You may have one support person who is at least 65 years old accompany you for your appointments.

## 2023-07-13 LAB — RHEUMATOID FACTOR: Rheumatoid fact SerPl-aCnc: 11.9 IU/mL (ref <14.0)

## 2023-07-14 LAB — MISC LABCORP TEST (SEND OUT): Labcorp test code: 6025

## 2023-07-15 LAB — PROTEIN ELECTROPHORESIS, SERUM
A/G Ratio: 1.3 (ref 0.7–1.7)
Albumin ELP: 3.5 g/dL (ref 2.9–4.4)
Alpha-1-Globulin: 0.2 g/dL (ref 0.0–0.4)
Alpha-2-Globulin: 0.7 g/dL (ref 0.4–1.0)
Beta Globulin: 0.9 g/dL (ref 0.7–1.3)
Gamma Globulin: 1 g/dL (ref 0.4–1.8)
Globulin, Total: 2.8 g/dL (ref 2.2–3.9)
Total Protein ELP: 6.3 g/dL (ref 6.0–8.5)

## 2023-07-15 LAB — COPPER, SERUM: Copper: 112 ug/dL (ref 80–158)

## 2023-07-15 LAB — ANTINUCLEAR ANTIBODIES, IFA: ANA Ab, IFA: NEGATIVE

## 2023-07-15 LAB — METHYLMALONIC ACID, SERUM: Methylmalonic Acid, Quantitative: 114 nmol/L (ref 0–378)

## 2023-07-22 NOTE — Progress Notes (Signed)
[facility] [address] [place] [address]   Clinic Day:  07/24/23   Referring physician: John Giovanni, MD  Patient Care Team: John Giovanni, MD as PCP - General (Family Medicine) Doreatha Massed, MD as Medical Oncologist (Hematology)   ASSESSMENT & PLAN:   Assessment:  1.  Leukopenia and neutropenia: - Patient seen at the request of Dr. Sudie Bailey. - 05/17/2023: WBC-3.9, Hb-12.3, PLT-261, MCV-86, 16% N, 71% L, 7% M, 3% ED, ANC 659. - 03/26/2022: WBC-3.9, ONG-295, Hb-12.1, PLT-256. - 04/24/2021: WBC-4.6, MWU-1324, N-30%, L-58%, M-7%, E-2% - 04/29/2019: WBC-4.4, 36% N, 53% L, 6% M, ANC 1.6 - She has minor cold 1-2 times per year.  Last sinus infection 1 month ago.  No B symptoms.  Occasional hot flashes. - Only new medication pantoprazole approximately started 2 years ago.  2.  Social/family history: - She works part-time as a Research officer, political party.  Previously worked as a Merchandiser, retail in a Company secretary.  No chemical exposure.  Current active smoker, half pack per day started at age 34. - Mother had "pelvis cancer".  Sister also died of cancer.  Plan:  1.  Leukopenia and neutropenia: - Labs from 07/15/23 are WNL.  WBC returned to normal but ANC remains low at 1 1.  No evidence of nutritional deficiencies, connective tissue disorders or infectious etiologies. No evidence of bone marrow infiltrative process. 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  -We will continue to monitor at this time.  PLAN SUMMARY: >> Reviewed labs.  >> RTC in 3-4 months for repeat labs and see MD/APP.     I spent 20 minutes dedicated to the care of this patient (face-to-face and non-face-to-face) on the date of the encounter to include what is described in the assessment and plan.   No orders of the defined types were placed in this encounter.   Mauro Kaufmann, NP   7/25/20248:58 AM  CHIEF COMPLAINT/PURPOSE OF  CONSULT:   Diagnosis: low level of neutrophil  Current Therapy:  none  HISTORY OF PRESENT ILLNESS:   [name] is a 67 y.o. female presenting to clinic today for follow-up for neutropenia.  She is here to review her lab results.  Reports doing well over the past few weeks. Denies any infections since her last visit.   Reports being active at home and walks daily.  She continues to mow her grass and take care of house chores.  Appetite and energy levels are 80 percent.  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  Her weight is stable.  She reports eating chicken and fish mostly.  She loves fruit and vegetables.   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 She reports she had a hernia within the last 4 years. She continues taking Protonix for GERD and vitamin D daily.  PAST MEDICAL HISTORY:   Past Medical History: Past Medical History:  Diagnosis Date   Arthritis    Coronary artery disease    a. s/p stenting of the proximal to mid LAD and PTCA of jailed diagonal branch in 2013 b. STEMI in 12/2014 with DES to distal RCA   Hypertension    Ischemic cardiomyopathy    a. EF 40-45% in 2013 b. EF 55% in 2016    Surgical History: Past Surgical History:  Procedure Laterality Date   BIOPSY  05/02/2022   Procedure: BIOPSY;  Surgeon: Lionel December  U, MD;  [facility];  Service: Endoscopy;;   CARDIAC CATHETERIZATION     CHOLECYSTECTOMY     CORONARY STENT PLACEMENT  07/15/12   ESOPHAGOGASTRODUODENOSCOPY (EGD) WITH PROPOFOL N/A 05/02/2022   Procedure: ESOPHAGOGASTRODUODENOSCOPY (EGD) WITH PROPOFOL;  Surgeon: Malissa Hippo, MD;  [facility]: [facility];  Service: Endoscopy;  Laterality: N/A;  1000   KNEE SURGERY     LEFT HEART CATHETERIZATION WITH CORONARY ANGIOGRAM N/A 07/13/2012   Procedure: LEFT HEART CATHETERIZATION WITH CORONARY ANGIOGRAM;  Surgeon: Marykay Lex, MD;  Location: [facility] CATH LAB;  Service: Cardiovascular;  Laterality: N/A;    LEFT HEART CATHETERIZATION WITH CORONARY ANGIOGRAM N/A 11/20/2012   Procedure: LEFT HEART CATHETERIZATION WITH CORONARY ANGIOGRAM;  Surgeon: Marykay Lex, MD;  Location: [facility] CATH LAB;  Service: Cardiovascular;  Laterality: N/A;   LEFT HEART CATHETERIZATION WITH CORONARY ANGIOGRAM N/A 12/30/2014   Procedure: LEFT HEART CATHETERIZATION WITH CORONARY ANGIOGRAM;  Surgeon: Lennette Bihari, MD;  Location: [facility] CATH LAB;  Service: Cardiovascular;  Laterality: N/A;   PERCUTANEOUS CORONARY STENT INTERVENTION (PCI-S)  07/13/2012   Procedure: PERCUTANEOUS CORONARY STENT INTERVENTION (PCI-S);  Surgeon: Marykay Lex, MD;  Location: [facility] CATH LAB;  Service: Cardiovascular;;   PERCUTANEOUS CORONARY STENT INTERVENTION (PCI-S)  12/30/2014   Procedure: PERCUTANEOUS CORONARY STENT INTERVENTION (PCI-S);  Surgeon: Lennette Bihari, MD;  Location: [facility] CATH LAB;  Service: Cardiovascular;;    Social History: Social History   Socioeconomic History   Marital status: Married    Spouse name: Not on file   Number of children: Not on file   Years of education: Not on file   Highest education level: Not on file  Occupational History   Not on file  Tobacco Use   Smoking status: Every Day    Current packs/day: 0.25    Average packs/day: 0.3 packs/day for 46.7 years (11.7 ttl pk-yrs)    Types: Cigarettes    Start date: 11/23/1976    Passive exposure: Current   Smokeless tobacco: Never   Tobacco comments:    2 ciggs per day  Vaping Use   Vaping status: Never Used  Substance and Sexual Activity   Alcohol use: No    Alcohol/week: 0.0 standard drinks of alcohol   Drug use: Yes    Types: Marijuana   Sexual activity: Yes  Other Topics Concern   Not on file  Social History Narrative   Not on file   Social Determinants of Health   Financial Resource Strain: Not on file  Food Insecurity: Not on file  Transportation Needs: Not on file  Physical Activity: Not on file  Stress: Not on file  Social Connections: Not on  file  Intimate Partner Violence: Not on file    Family History: Family History  Problem Relation Age of Onset   Hypertension Mother    Hypertension Father    Hypertension Sister    Hypertension Brother    Coronary artery disease Brother 36    Current Medications:  Current Outpatient Medications:    acetaminophen (TYLENOL) 650 MG CR tablet, Take 1,300 mg by mouth every 8 (eight) hours as needed for pain., Disp: , Rfl:    benazepril-hydrochlorthiazide (LOTENSIN HCT) 20-25 MG per tablet, Take 1 tablet by mouth daily. <please make appointment with cardiologist for refills>, Disp: 30 tablet, Rfl: 1   brimonidine (ALPHAGAN P) 0.1 % SOLN, Place 2 drops into the left eye daily as needed (irritation)., Disp: , Rfl:    carvedilol (COREG) 6.25 MG tablet,  TAKE ONE TABLET (6.25MG  TOTAL) BY MOUTH TWO TIMES DAILY, Disp: 120 tablet, Rfl: 2   cholecalciferol (VITAMIN D3) 25 MCG (1000 UNIT) tablet, Take 1,000 Units by mouth daily., Disp: , Rfl:    diazepam (VALIUM) 5 MG tablet, Take 5 mg by mouth at bedtime as needed (for sleep). , Disp: , Rfl:    Docusate Sodium (COLACE PO), Take 1 tablet by mouth daily., Disp: , Rfl:    fexofenadine-pseudoephedrine (ALLEGRA-D 24) 180-240 MG 24 hr tablet, Take 1 tablet by mouth daily., Disp: , Rfl:    pantoprazole (PROTONIX) 40 MG tablet, Take 1 tablet (40 mg total) by mouth daily before breakfast., Disp: 30 tablet, Rfl: 5   potassium chloride (KLOR-CON) 10 MEQ tablet, Take 10 mEq by mouth daily., Disp: , Rfl:    rosuvastatin (CRESTOR) 10 MG tablet, Take 10 mg by mouth daily., Disp: , Rfl:    Simethicone (GAS-X PO), Take 2 tablets by mouth daily as needed (gas)., Disp: , Rfl:    nitroGLYCERIN (NITROSTAT) 0.4 MG SL tablet, PLACE ONE TABLET UNDER THE TONGUE EVERY FIVE MINUTES FOR THREE DOSES AS NEEDED (Patient not taking: Reported on 07/24/2023), Disp: 25 tablet, Rfl: 3   Allergies: Allergies  Allergen Reactions   Lipitor [Atorvastatin] Other (See Comments)    Severe  bone aches    REVIEW OF SYSTEMS:   Review of Systems  Constitutional:  Negative for appetite change, fatigue (Occasional), fever and unexpected weight change.  HENT:   Negative for nosebleeds, sore throat and trouble swallowing.   Eyes: Negative.   Respiratory: Negative.  Negative for cough, shortness of breath and wheezing.   Cardiovascular: Negative.  Negative for chest pain and leg swelling.  Gastrointestinal:  Negative for abdominal pain, blood in stool, constipation, diarrhea, nausea and vomiting.  Endocrine: Negative.   Genitourinary: Negative.  Negative for bladder incontinence, hematuria and nocturia.   Musculoskeletal:  Positive for arthralgias (Bil knees). Negative for back pain and flank pain.  Skin: Negative.   Neurological: Negative.  Negative for dizziness, headaches, light-headedness and numbness.  Hematological: Negative.   Psychiatric/Behavioral: Negative.  Negative for confusion. The patient is not nervous/anxious.      VITALS:   Blood pressure (!) 145/83, pulse 63, temperature (!) 97.5 F (36.4 C), temperature source Tympanic, resp. rate 16, weight 189 lb 13.1 oz (86.1 kg), SpO2 100%.  Wt Readings from Last 3 Encounters:  07/24/23 189 lb 13.1 oz (86.1 kg)  07/11/23 191 lb 6.4 oz (86.8 kg)  01/15/23 188 lb (85.3 kg)    Body mass index is 28.86 kg/m.   PHYSICAL EXAM:   Physical Exam Constitutional:      Appearance: Normal appearance.  HENT:     Head: Normocephalic and atraumatic.  Eyes:     Pupils: Pupils are equal, round, and reactive to light.  Cardiovascular:     Rate and Rhythm: Normal rate and regular rhythm.     Heart sounds: Normal heart sounds. No murmur heard. Pulmonary:     Effort: Pulmonary effort is normal.     Breath sounds: Normal breath sounds. No wheezing.  Abdominal:     General: Bowel sounds are normal. There is no distension.     Palpations: Abdomen is soft.     Tenderness: There is no abdominal tenderness.  Musculoskeletal:         General: Normal range of motion.     Cervical back: Normal range of motion.  Skin:    General: Skin is warm and dry.  Findings: No rash.  Neurological:     Mental Status: She is alert and oriented to person, place, and time.  Psychiatric:        Judgment: Judgment normal.     LABS:      Latest Ref Rng & Units 07/11/2023    8:59 AM 03/15/2022   11:55 AM 07/18/2017    5:13 PM  CBC  WBC 4.0 - 10.5 K/uL 4.4  4.2  4.7   Hemoglobin 12.0 - 15.0 g/dL 21.3  08.6  57.8   Hematocrit 36.0 - 46.0 % 38.9  38.0  37.9   Platelets 150 - 400 K/uL 275  255  258       Latest Ref Rng & Units 04/30/2022    8:18 AM 03/15/2022   11:55 AM 07/18/2017    5:13 PM  CMP  Glucose 70 - 99 mg/dL 98  95  469   BUN 8 - 23 mg/dL 8  11  11    Creatinine 0.44 - 1.00 mg/dL 6.29  5.28  4.13   Sodium 135 - 145 mmol/L 135  138  136   Potassium 3.5 - 5.1 mmol/L 3.3  3.5  2.8   Chloride 98 - 111 mmol/L 99  100  102   CO2 22 - 32 mmol/L 29  27  25    Calcium 8.9 - 10.3 mg/dL 9.7  9.4  9.0   Total Protein 6.5 - 8.1 g/dL  6.7  6.7   Total Bilirubin 0.3 - 1.2 mg/dL  0.3  0.5   Alkaline Phos 38 - 126 U/L  34  47   AST 15 - 41 U/L  18  15   ALT 0 - 44 U/L  16  14      No results found for: "CEA1", "CEA" / No results found for: "CEA1", "CEA" No results found for: "PSA1" No results found for: "CAN199" No results found for: "CAN125"  Lab Results  Component Value Date   TOTALPROTELP 6.3 07/11/2023   ALBUMINELP 3.5 07/11/2023   A1GS 0.2 07/11/2023   A2GS 0.7 07/11/2023   BETS 0.9 07/11/2023   GAMS 1.0 07/11/2023   MSPIKE Not Observed 07/11/2023   SPEI Comment 07/11/2023   No results found for: "TIBC", "FERRITIN", "IRONPCTSAT" Lab Results  Component Value Date   LDH 165 07/11/2023     STUDIES:   No results found.

## 2023-07-24 ENCOUNTER — Inpatient Hospital Stay (HOSPITAL_BASED_OUTPATIENT_CLINIC_OR_DEPARTMENT_OTHER): Payer: 59 | Admitting: Oncology

## 2023-07-24 VITALS — BP 145/83 | HR 63 | Temp 97.5°F | Resp 16 | Wt 189.8 lb

## 2023-07-24 DIAGNOSIS — D709 Neutropenia, unspecified: Secondary | ICD-10-CM | POA: Diagnosis not present

## 2023-07-24 DIAGNOSIS — D708 Other neutropenia: Secondary | ICD-10-CM | POA: Diagnosis not present

## 2023-08-11 ENCOUNTER — Ambulatory Visit (INDEPENDENT_AMBULATORY_CARE_PROVIDER_SITE_OTHER): Payer: 59 | Admitting: Gastroenterology

## 2023-08-11 ENCOUNTER — Encounter (INDEPENDENT_AMBULATORY_CARE_PROVIDER_SITE_OTHER): Payer: Self-pay | Admitting: Gastroenterology

## 2023-08-11 VITALS — BP 116/79 | HR 59 | Temp 97.5°F | Ht 64.0 in | Wt 187.1 lb

## 2023-08-11 DIAGNOSIS — R110 Nausea: Secondary | ICD-10-CM

## 2023-08-11 DIAGNOSIS — K2970 Gastritis, unspecified, without bleeding: Secondary | ICD-10-CM

## 2023-08-11 DIAGNOSIS — K219 Gastro-esophageal reflux disease without esophagitis: Secondary | ICD-10-CM

## 2023-08-11 MED ORDER — PANTOPRAZOLE SODIUM 40 MG PO TBEC: 40.0000 mg | DELAYED_RELEASE_TABLET | Freq: Every day | ORAL | 3 refills | Status: AC

## 2023-08-11 NOTE — Progress Notes (Signed)
Referring Provider: John Giovanni, MD Primary Care Physician:  John Giovanni, MD Primary GI Physician: Previously Rehman (Dr. Tasia Catchings)   Chief Complaint  Patient presents with   Follow-up    Patient here today for a yearly follow up. Patient says she has some issues with gas and bloating. Her [name] is controlled on Pantoprazole 40 mg prn. She uses gas x prn.   HPI:   [name] is a 67 y.o. female with past medical history of  arthritis, CAD, HTN, ischemic cardiomyopathy, GERD, gastritis   Patient presenting today for follow up of GERD and gastritis   Last seen August 2023, at that time doing well, taking protonix 2-3x/week, taking goody powder on occasion.   Recommended to continue with reflux precautions, protonix PRN, avoid NSAIDs, update [place] on records of last Colonoscopy.  Last labs in July with normal hgb at 12.4, B12 420, folate 10.9, MMA 114  Present:  Patient states she has cut back on meats and increased fruits and veggies. She notes that she has had  more bloating sensation usually if she eats and lays down soon after, if she does not do this she feels better. She notes some nausea upon waking at times, this has been ongoing for while and feels that if she eats or drinks something this improves. No vomiting. She tries not to over eat but denies early satiety. Denies heartburn or acid regurgitation. She is taking her protonix 40mg  usually once a day but sometimes will skip her dose. She takes goody powder very occasionally, maybe once every few months. Denies any other NSAIDs, using tylenol for the most part.   She was having constipation previously when on PO iron, but now off iron and having BM usually once per day. She has cut back on her breads and sweet intake.  Patient denies melena, hematochezia, vomiting, diarrhea, constipation, dysphagia, odyonophagia, early satiety or weight loss.   Last Colonoscopy:3-4 years ago, unsure where this was done, normal per  patient-no polyps, no family history of CRC Last Endoscopy:May 2023 - Normal hypopharynx. - Normal esophagus. - Z-line regular, 37 cm from the incisors. - 3 cm hiatal hernia. - Gastritis. Biopsied-Negative for h pylori - Scar in the prepyloric region of the stomach. - Three non-bleeding angioectasias in the duodenum. - Normal second portion of the duodenum and major papilla.   Past Medical History:  Diagnosis Date   Arthritis    Coronary artery disease    a. s/p stenting of the proximal to mid LAD and PTCA of jailed diagonal branch in 2013 b. STEMI in 12/2014 with DES to distal RCA   Hypertension    Ischemic cardiomyopathy    a. EF 40-45% in 2013 b. EF 55% in 2016    Past Surgical History:  Procedure Laterality Date   BIOPSY  05/02/2022   Procedure: BIOPSY;  Surgeon: Malissa Hippo, MD;  [facility];  Service: Endoscopy;;   CARDIAC CATHETERIZATION     CHOLECYSTECTOMY     CORONARY STENT PLACEMENT  07/15/12   ESOPHAGOGASTRODUODENOSCOPY (EGD) WITH PROPOFOL N/A 05/02/2022   Procedure: ESOPHAGOGASTRODUODENOSCOPY (EGD) WITH PROPOFOL;  Surgeon: Malissa Hippo, MD;  [facility]: [facility];  Service: Endoscopy;  Laterality: N/A;  1000   KNEE SURGERY     LEFT HEART CATHETERIZATION WITH CORONARY ANGIOGRAM N/A 07/13/2012   Procedure: LEFT HEART CATHETERIZATION WITH CORONARY ANGIOGRAM;  Surgeon: Marykay Lex, MD;  Location: [facility] CATH LAB;  Service: Cardiovascular;  Laterality: N/A;   LEFT  HEART CATHETERIZATION WITH CORONARY ANGIOGRAM N/A 11/20/2012   Procedure: LEFT HEART CATHETERIZATION WITH CORONARY ANGIOGRAM;  Surgeon: Marykay Lex, MD;  Location: [facility] CATH LAB;  Service: Cardiovascular;  Laterality: N/A;   LEFT HEART CATHETERIZATION WITH CORONARY ANGIOGRAM N/A 12/30/2014   Procedure: LEFT HEART CATHETERIZATION WITH CORONARY ANGIOGRAM;  Surgeon: Lennette Bihari, MD;  Location: [facility] CATH LAB;  Service: Cardiovascular;  Laterality: N/A;   PERCUTANEOUS CORONARY STENT INTERVENTION  (PCI-S)  07/13/2012   Procedure: PERCUTANEOUS CORONARY STENT INTERVENTION (PCI-S);  Surgeon: Marykay Lex, MD;  Location: [facility] CATH LAB;  Service: Cardiovascular;;   PERCUTANEOUS CORONARY STENT INTERVENTION (PCI-S)  12/30/2014   Procedure: PERCUTANEOUS CORONARY STENT INTERVENTION (PCI-S);  Surgeon: Lennette Bihari, MD;  Location: [facility] CATH LAB;  Service: Cardiovascular;;    Current Outpatient Medications  Medication Sig Dispense Refill   acetaminophen (TYLENOL) 650 MG CR tablet Take 1,300 mg by mouth every 8 (eight) hours as needed for pain.     benazepril-hydrochlorthiazide (LOTENSIN HCT) 20-25 MG per tablet Take 1 tablet by mouth daily. <please make appointment with cardiologist for refills> 30 tablet 1   brimonidine (ALPHAGAN P) 0.1 % SOLN Place 2 drops into the left eye daily as needed (irritation).     carvedilol (COREG) 6.25 MG tablet TAKE ONE TABLET (6.25MG  TOTAL) BY MOUTH TWO TIMES DAILY 120 tablet 2   cholecalciferol (VITAMIN D3) 25 MCG (1000 UNIT) tablet Take 1,000 Units by mouth daily.     diazepam (VALIUM) 5 MG tablet Take 5 mg by mouth at bedtime as needed (for sleep).      Docusate Sodium (COLACE PO) Take 1 tablet by mouth daily.     fexofenadine-pseudoephedrine (ALLEGRA-D 24) 180-240 MG 24 hr tablet Take 1 tablet by mouth daily.     nitroGLYCERIN (NITROSTAT) 0.4 MG SL tablet PLACE ONE TABLET UNDER THE TONGUE EVERY FIVE MINUTES FOR THREE DOSES AS NEEDED 25 tablet 3   pantoprazole (PROTONIX) 40 MG tablet Take 1 tablet (40 mg total) by mouth daily before breakfast. 30 tablet 5   potassium chloride (KLOR-CON) 10 MEQ tablet Take 10 mEq by mouth daily.     rosuvastatin (CRESTOR) 10 MG tablet Take 10 mg by mouth daily.     Simethicone (GAS-X PO) Take 2 tablets by mouth daily as needed (gas).     No current facility-administered medications for this visit.    Allergies as of 08/11/2023 - Review Complete 08/11/2023  Allergen Reaction Noted   Lipitor [atorvastatin] Other (See Comments)  07/14/2012    Family History  Problem Relation Age of Onset   Hypertension Mother    Hypertension Father    Hypertension Sister    Hypertension Brother    Coronary artery disease Brother 68    Social History   Socioeconomic History   Marital status: Married    Spouse name: Not on file   Number of children: Not on file   Years of education: Not on file   Highest education level: Not on file  Occupational History   Not on file  Tobacco Use   Smoking status: Every Day    Current packs/day: 0.25    Average packs/day: 0.3 packs/day for 46.7 years (11.7 ttl pk-yrs)    Types: Cigarettes    Start date: 11/23/1976    Passive exposure: Current   Smokeless tobacco: Never   Tobacco comments:    2 ciggs per day  Vaping Use   Vaping status: Never Used  Substance and Sexual Activity   Alcohol use:  No    Alcohol/week: 0.0 standard drinks of alcohol   Drug use: Not Currently    Types: Marijuana   Sexual activity: Yes  Other Topics Concern   Not on file  Social History Narrative   Not on file   Social Determinants of Health   Financial Resource Strain: Not on file  Food Insecurity: Not on file  Transportation Needs: Not on file  Physical Activity: Not on file  Stress: Not on file  Social Connections: Not on file    Review of systems General: negative for malaise, night sweats, fever, chills, weight loss Neck: Negative for lumps, goiter, pain and significant neck swelling Resp: Negative for cough, wheezing, dyspnea at rest CV: Negative for chest pain, leg swelling, palpitations, orthopnea GI: denies melena, hematochezia, vomiting, diarrhea, constipation, dysphagia, odyonophagia, early satiety or unintentional weight loss. +nausea  MSK: Negative for joint pain or swelling, back pain, and muscle pain. Derm: Negative for itching or rash Psych: Denies depression, anxiety, memory loss, confusion. No homicidal or suicidal ideation.  Heme: Negative for prolonged bleeding,  bruising easily, and swollen nodes. Endocrine: Negative for cold or heat intolerance, polyuria, polydipsia and goiter. Neuro: negative for tremor, gait imbalance, syncope and seizures. The remainder of the review of systems is noncontributory.  Physical Exam: BP 116/79 (BP Location: Left Arm, Patient Position: Sitting, Cuff Size: Normal)   Pulse (!) 59   Temp (!) 97.5 F (36.4 C) (Temporal)   Ht 5\' 4"  (1.626 m)   Wt 187 lb 1.6 oz (84.9 kg)   BMI 32.12 kg/m  General:   Alert and oriented. No distress noted. Pleasant and cooperative.  Head:  Normocephalic and atraumatic. Eyes:  Conjuctiva clear without scleral icterus. Mouth:  Oral mucosa pink and moist. Good dentition. No lesions. Heart: Normal rate and rhythm, s1 and s2 heart sounds present.  Lungs: Clear lung sounds in all lobes. Respirations equal and unlabored. Abdomen:  +BS, soft, non-tender and non-distended. No rebound or guarding. No HSM or masses noted. Derm: No palmar erythema or jaundice Msk:  Symmetrical without gross deformities. Normal posture. Extremities:  Without edema. Neurologic:  Alert and  oriented x4 Psych:  Alert and cooperative. Normal mood and affect.  Invalid input(s): "6 MONTHS"   ASSESSMENT: NAIYAH JEWETT is a 67 y.o. female presenting today for follow up of GERD and gastritis  Patient reports GERD well controlled, sometimes having some nausea/queasiness upon waking, improved with her PPI and eating. Denies vomiting. No rectal bleeding, melena, early satiety or weight loss. EGD in May 2023 with gastritis, no H pylori. She is taking PPI usually daily but sometimes skips a dose. As she had recent EGD and has no red flag symptoms,encouraged to continue to take PPI daily, good reflux precautions to include being mindful of greasy, spicy, fried, citrus foods,  caffeine, carbonated drinks, chocolate and alcohol and staying upright 2-3 hours after eating, prior to lying down and avoid eating late in the evenings.  If her symptoms worsen, she has new associated symptoms or PPI is no longer helping, she needs to make me aware, though suspect symptoms are secondary to known gastritis. I encourage her again to be very limiting on use of any NSAID medications.  Last colonoscopy believed to be 3-4 years ago, normal per patient, no history of polyps and no family history of CRC. She Is unsure where this was done, if she can get records that would be helpful In knowing exact timing of next CRC screening, though would likely  be good for 10 years given her average risk.     PLAN:  Good reflux precautions  2. Continue with dietary changes  3. Continue Protonix 40mg  daily  4. Update [place] on colonoscopy records  5. Avoid NSAIDs   All questions were answered, patient verbalized understanding and is in agreement with plan as outlined above.   Follow Up: 1 year   Oree Mirelez L. Jeanmarie Hubert, MSN, APRN, AGNP-C Adult-Gerontology Nurse Practitioner [facility]

## 2023-08-11 NOTE — Patient Instructions (Addendum)
Be mindful of greasy, spicy, fried, citrus foods, caffeine, carbonated drinks, chocolate and alcohol as these can increase reflux symptoms Stay upright 2-3 hours after eating, prior to lying down and avoid eating late in the evenings. Continue with dietary changes you have made and taking your protonix 40mg  daily If nausea persists/worsens, or you have new associated symptoms, please let me know  Please avoid NSAIDs (advil, aleve, naproxen, goody powder, ibuprofen) as these can be very hard on your GI tract, causing inflammation, ulcers and damage to the lining of your GI tract.   Follow up 1 year

## 2023-10-09 DIAGNOSIS — Z0001 Encounter for general adult medical examination with abnormal findings: Secondary | ICD-10-CM | POA: Diagnosis not present

## 2023-10-09 DIAGNOSIS — J449 Chronic obstructive pulmonary disease, unspecified: Secondary | ICD-10-CM | POA: Diagnosis not present

## 2023-10-09 DIAGNOSIS — Z23 Encounter for immunization: Secondary | ICD-10-CM | POA: Diagnosis not present

## 2023-10-09 DIAGNOSIS — I480 Paroxysmal atrial fibrillation: Secondary | ICD-10-CM | POA: Diagnosis not present

## 2023-10-09 DIAGNOSIS — I10 Essential (primary) hypertension: Secondary | ICD-10-CM | POA: Diagnosis not present

## 2023-10-09 DIAGNOSIS — F17210 Nicotine dependence, cigarettes, uncomplicated: Secondary | ICD-10-CM | POA: Diagnosis not present

## 2023-10-09 DIAGNOSIS — E7849 Other hyperlipidemia: Secondary | ICD-10-CM | POA: Diagnosis not present

## 2023-10-09 DIAGNOSIS — I5022 Chronic systolic (congestive) heart failure: Secondary | ICD-10-CM | POA: Diagnosis not present

## 2023-10-09 DIAGNOSIS — M170 Bilateral primary osteoarthritis of knee: Secondary | ICD-10-CM | POA: Diagnosis not present

## 2023-10-09 DIAGNOSIS — Z7189 Other specified counseling: Secondary | ICD-10-CM | POA: Diagnosis not present

## 2023-10-09 DIAGNOSIS — Z1389 Encounter for screening for other disorder: Secondary | ICD-10-CM | POA: Diagnosis not present

## 2023-11-18 ENCOUNTER — Other Ambulatory Visit: Payer: Self-pay | Admitting: Student

## 2023-11-20 ENCOUNTER — Other Ambulatory Visit: Payer: Self-pay

## 2023-11-20 MED ORDER — CARVEDILOL 6.25 MG PO TABS: 6.2500 mg | ORAL_TABLET | Freq: Two times a day (BID) | ORAL | 3 refills | Status: AC

## 2023-12-18 ENCOUNTER — Other Ambulatory Visit: Payer: Self-pay

## 2023-12-18 DIAGNOSIS — D708 Other neutropenia: Secondary | ICD-10-CM

## 2023-12-19 ENCOUNTER — Inpatient Hospital Stay: Payer: 59 | Attending: Family Medicine

## 2023-12-26 ENCOUNTER — Inpatient Hospital Stay: Payer: 59 | Admitting: Oncology

## 2023-12-26 DIAGNOSIS — E559 Vitamin D deficiency, unspecified: Secondary | ICD-10-CM | POA: Diagnosis not present

## 2023-12-26 DIAGNOSIS — Z0001 Encounter for general adult medical examination with abnormal findings: Secondary | ICD-10-CM | POA: Diagnosis not present

## 2023-12-26 DIAGNOSIS — Z23 Encounter for immunization: Secondary | ICD-10-CM | POA: Diagnosis not present

## 2023-12-26 DIAGNOSIS — I10 Essential (primary) hypertension: Secondary | ICD-10-CM | POA: Diagnosis not present

## 2023-12-26 DIAGNOSIS — E7849 Other hyperlipidemia: Secondary | ICD-10-CM | POA: Diagnosis not present

## 2024-01-16 DIAGNOSIS — J449 Chronic obstructive pulmonary disease, unspecified: Secondary | ICD-10-CM | POA: Diagnosis not present

## 2024-01-16 DIAGNOSIS — I480 Paroxysmal atrial fibrillation: Secondary | ICD-10-CM | POA: Diagnosis not present

## 2024-01-16 DIAGNOSIS — I5022 Chronic systolic (congestive) heart failure: Secondary | ICD-10-CM | POA: Diagnosis not present

## 2024-01-16 DIAGNOSIS — M170 Bilateral primary osteoarthritis of knee: Secondary | ICD-10-CM | POA: Diagnosis not present

## 2024-01-16 DIAGNOSIS — I10 Essential (primary) hypertension: Secondary | ICD-10-CM | POA: Diagnosis not present

## 2024-01-16 DIAGNOSIS — Z23 Encounter for immunization: Secondary | ICD-10-CM | POA: Diagnosis not present

## 2024-01-26 ENCOUNTER — Encounter: Payer: Self-pay | Admitting: Internal Medicine

## 2024-01-26 ENCOUNTER — Ambulatory Visit: Payer: 59 | Attending: Internal Medicine | Admitting: Internal Medicine

## 2024-01-26 VITALS — BP 142/96 | HR 58 | Ht 67.0 in | Wt 199.2 lb

## 2024-01-26 DIAGNOSIS — I255 Ischemic cardiomyopathy: Secondary | ICD-10-CM | POA: Diagnosis not present

## 2024-01-26 DIAGNOSIS — I2119 ST elevation (STEMI) myocardial infarction involving other coronary artery of inferior wall: Secondary | ICD-10-CM

## 2024-01-26 DIAGNOSIS — E7849 Other hyperlipidemia: Secondary | ICD-10-CM | POA: Diagnosis not present

## 2024-01-26 DIAGNOSIS — I2510 Atherosclerotic heart disease of native coronary artery without angina pectoris: Secondary | ICD-10-CM | POA: Diagnosis not present

## 2024-01-26 DIAGNOSIS — I10 Essential (primary) hypertension: Secondary | ICD-10-CM | POA: Diagnosis not present

## 2024-01-26 DIAGNOSIS — E785 Hyperlipidemia, unspecified: Secondary | ICD-10-CM | POA: Insufficient documentation

## 2024-01-26 NOTE — Patient Instructions (Addendum)
Medication Instructions:   Your physician recommends that you continue on your current medications as directed. Please refer to the Current Medication list given to you today.  Take enteric coated Aspirin 81 mg daily  Take Rosuvastatin at bedtime    Labwork: None today  Testing/Procedures: None today  Follow-Up: 1 year  Any Other Special Instructions Will Be Listed Below (If Applicable).  If you need a refill on your cardiac medications before your next appointment, please call your pharmacy.

## 2024-01-26 NOTE — Progress Notes (Signed)
Cardiology Office Note  Date: 01/26/2024   ID: Marki, Frede 1956/08/14, MRN [id number]  PCP:  Benetta Spar, MD  Cardiologist:  None Electrophysiologist:  None   History of Present Illness: [name] is a 68 y.o. 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  Patient is here for follow-up visit with me. No interval ER visits or hospitalizations. Patient denied any rest or exertional chest discomfort, tightness, heaviness or pressure, rest or exertional dyspnea, palpitations, light-headedness, syncope and LE swelling. Compliant with medications and no side-effects. No bleeding complications.  Currently smokes 6 to 7 cigarettes/day.  She previously used to smoke 1 pack/day.  Denies having claudication.  Currently is taking aspirin every other day.   Past Medical History:  Diagnosis Date   Arthritis    Coronary artery disease    a. s/p stenting of the proximal to mid LAD and PTCA of jailed diagonal branch in 2013 b. STEMI in 12/2014 with DES to distal RCA   Hypertension    Ischemic cardiomyopathy    a. EF 40-45% in 2013 b. EF 55% in 2016    Past Surgical History:  Procedure Laterality Date   BIOPSY  05/02/2022   Procedure: BIOPSY;  Surgeon: Malissa Hippo, MD;  Location: [facility];  Service: Endoscopy;;   CARDIAC CATHETERIZATION     CHOLECYSTECTOMY     CORONARY STENT PLACEMENT  07/15/12   ESOPHAGOGASTRODUODENOSCOPY (EGD) WITH PROPOFOL N/A 05/02/2022   Procedure: ESOPHAGOGASTRODUODENOSCOPY (EGD) WITH PROPOFOL;  Surgeon: Malissa Hippo, MD;  Location: [facility];  Service: Endoscopy;  Laterality: N/A;  1000   KNEE SURGERY     LEFT HEART CATHETERIZATION WITH CORONARY ANGIOGRAM N/A 07/13/2012   Procedure: LEFT HEART CATHETERIZATION WITH CORONARY ANGIOGRAM;  Surgeon: Marykay Lex, MD;  Location: [facility] CATH  LAB;  Service: Cardiovascular;  Laterality: N/A;   LEFT HEART CATHETERIZATION WITH CORONARY ANGIOGRAM N/A 11/20/2012   Procedure: LEFT HEART CATHETERIZATION WITH CORONARY ANGIOGRAM;  Surgeon: Marykay Lex, MD;  Location: [facility] CATH LAB;  Service: Cardiovascular;  Laterality: N/A;   LEFT HEART CATHETERIZATION WITH CORONARY ANGIOGRAM N/A 12/30/2014   Procedure: LEFT HEART CATHETERIZATION WITH CORONARY ANGIOGRAM;  Surgeon: Lennette Bihari, MD;  Location: [facility] CATH LAB;  Service: Cardiovascular;  Laterality: N/A;   PERCUTANEOUS CORONARY STENT INTERVENTION (PCI-S)  07/13/2012   Procedure: PERCUTANEOUS CORONARY STENT INTERVENTION (PCI-S);  Surgeon: Marykay Lex, MD;  Location: [facility] CATH LAB;  Service: Cardiovascular;;   PERCUTANEOUS CORONARY STENT INTERVENTION (PCI-S)  12/30/2014   Procedure: PERCUTANEOUS CORONARY STENT INTERVENTION (PCI-S);  Surgeon: Lennette Bihari, MD;  Location: [facility] CATH LAB;  Service: Cardiovascular;;    Current Outpatient Medications  Medication Sig Dispense Refill   acetaminophen (TYLENOL) 650 MG CR tablet Take 1,300 mg by mouth every 8 (eight) hours as needed for pain.     benazepril-hydrochlorthiazide (LOTENSIN HCT) 20-25 MG per tablet Take 1 tablet by mouth daily. <please make appointment with cardiologist for refills> 30 tablet 1   brimonidine (ALPHAGAN P) 0.1 % SOLN Place 2 drops into the left eye daily as needed (irritation).     carvedilol (COREG) 6.25 MG tablet Take 1 tablet (6.25 mg total) by mouth 2 (two) times daily with a meal. 180 tablet 3   cholecalciferol (VITAMIN D3) 25 MCG (1000 UNIT) tablet  Take 1,000 Units by mouth daily.     diazepam (VALIUM) 5 MG tablet Take 5 mg by mouth at bedtime as needed (for sleep).      Docusate Sodium (COLACE PO) Take 1 tablet by mouth daily.     fexofenadine-pseudoephedrine (ALLEGRA-D 24) 180-240 MG 24 hr tablet Take 1 tablet by mouth daily.     nitroGLYCERIN (NITROSTAT) 0.4 MG SL tablet PLACE ONE TABLET UNDER THE TONGUE EVERY FIVE MINUTES  FOR THREE DOSES AS NEEDED 25 tablet 3   pantoprazole (PROTONIX) 40 MG tablet Take 1 tablet (40 mg total) by mouth daily before breakfast. 90 tablet 3   potassium chloride (KLOR-CON) 10 MEQ tablet Take 10 mEq by mouth daily.     rosuvastatin (CRESTOR) 10 MG tablet Take 10 mg by mouth daily.     Simethicone (GAS-X PO) Take 2 tablets by mouth daily as needed (gas).     No current facility-administered medications for this visit.   Allergies:  Lipitor [atorvastatin]   Social History: The patient  reports that she has been smoking cigarettes. She started smoking about 47 years ago. She has a 11.8 pack-year smoking history. She has been exposed to tobacco smoke. She has never used smokeless tobacco. She reports that she does not currently use drugs after having used the following drugs: Marijuana. She reports that she does not drink alcohol.   Family History: The patient's family history includes Coronary artery disease (age of onset: 90) in her brother; Hypertension in her brother, father, mother, and sister.   ROS:  Please see the history of present illness. Otherwise, complete review of systems is positive for none.  All other systems are reviewed and negative.   Physical Exam: VS:  BP (!) 142/96   Pulse (!) 58   Ht 5\' 7"  (1.702 m)   Wt 199 lb 3.2 oz (90.4 kg)   SpO2 98%   BMI 31.20 kg/m , BMI Body mass index is 31.2 kg/m.  Wt Readings from Last 3 Encounters:  01/26/24 199 lb 3.2 oz (90.4 kg)  08/11/23 187 lb 1.6 oz (84.9 kg)  07/24/23 189 lb 13.1 oz (86.1 kg)    General: Patient appears comfortable at rest. HEENT: Conjunctiva and lids normal, oropharynx clear with moist mucosa. Neck: Supple, no elevated JVP or carotid bruits, no thyromegaly. Lungs: Clear to auscultation, nonlabored breathing at rest. Cardiac: Regular rate and rhythm, no S3 or significant systolic murmur, no pericardial rub. Abdomen: Soft, nontender, no hepatomegaly, bowel sounds present, no guarding or  rebound. Extremities: No pitting edema, distal pulses 2+. Skin: Warm and dry. Musculoskeletal: No kyphosis. Neuropsychiatric: Alert and oriented x3, affect grossly appropriate.  Recent Labwork: 07/11/2023: Hemoglobin 12.4; Platelets 275     Component Value Date/Time   CHOL 165 07/13/2012 0505   TRIG 43 07/13/2012 0505   HDL 68 07/13/2012 0505   CHOLHDL 2.4 07/13/2012 0505   VLDL 9 07/13/2012 0505   LDLCALC 88 07/13/2012 0505     Assessment and Plan:  CAD s/p LAD PCI and PTCA of jailed diagonal branch in 2013, STEMI in 2016 s/p RCA PCI, angina free: No interval ER visits or hospitalizations for chest pain.  Currently taking aspirin every other day, instructed her to take aspirin 81 mg once daily.  Continue rosuvastatin 10 mg at bedtime.  HLD, unknown values: Continue rosuvastatin 10 mg nightly (currently taking atorvastatin in the morning, instructed to take it at bedtime).  I reviewed her lipid panel, LDL 83 recently.  Goal LDL less than  70.  Previously did not tolerate atorvastatin due to myalgias.  Will not increase rosuvastatin dose now due to concern for myalgias.  Can repeat lipid panel with PCP in the next visit provide she takes rosuvastatin at bedtime.  If LDL more than 70, can add Zetia.  HTN, controlled: Continue benazepril-HCTZ 20-25 mg once daily and carvedilol 6.5 mg twice daily.  Follows with PCP.  Nicotine abuse: Currently smokes 6 to 7 cigarettes/day.  Previously used to smoke 1 pack/day.  Smoking cessation counseling provided.    Medication Adjustments/Labs and Tests Ordered: Current medicines are reviewed at length with the patient today.  Concerns regarding medicines are outlined above.    Disposition:  Follow up 1 year  Signed, Payslie Mccaig Verne Spurr, MD, 01/26/2024 10:55 AM    [facility] [address], [place], [place] [address]

## 2024-03-23 DIAGNOSIS — I480 Paroxysmal atrial fibrillation: Secondary | ICD-10-CM | POA: Diagnosis not present

## 2024-03-23 DIAGNOSIS — I5022 Chronic systolic (congestive) heart failure: Secondary | ICD-10-CM | POA: Diagnosis not present

## 2024-03-23 DIAGNOSIS — J449 Chronic obstructive pulmonary disease, unspecified: Secondary | ICD-10-CM | POA: Diagnosis not present

## 2024-03-23 DIAGNOSIS — I10 Essential (primary) hypertension: Secondary | ICD-10-CM | POA: Diagnosis not present

## 2024-03-23 DIAGNOSIS — E7849 Other hyperlipidemia: Secondary | ICD-10-CM | POA: Diagnosis not present

## 2024-03-23 DIAGNOSIS — J310 Chronic rhinitis: Secondary | ICD-10-CM | POA: Diagnosis not present

## 2024-04-23 DIAGNOSIS — I5022 Chronic systolic (congestive) heart failure: Secondary | ICD-10-CM | POA: Diagnosis not present

## 2024-04-23 DIAGNOSIS — I10 Essential (primary) hypertension: Secondary | ICD-10-CM | POA: Diagnosis not present

## 2024-05-23 DIAGNOSIS — I5022 Chronic systolic (congestive) heart failure: Secondary | ICD-10-CM | POA: Diagnosis not present

## 2024-05-23 DIAGNOSIS — I10 Essential (primary) hypertension: Secondary | ICD-10-CM | POA: Diagnosis not present

## 2024-06-23 DIAGNOSIS — I5022 Chronic systolic (congestive) heart failure: Secondary | ICD-10-CM | POA: Diagnosis not present

## 2024-06-23 DIAGNOSIS — I10 Essential (primary) hypertension: Secondary | ICD-10-CM | POA: Diagnosis not present

## 2024-07-09 DIAGNOSIS — M170 Bilateral primary osteoarthritis of knee: Secondary | ICD-10-CM | POA: Diagnosis not present

## 2024-07-09 DIAGNOSIS — I5022 Chronic systolic (congestive) heart failure: Secondary | ICD-10-CM | POA: Diagnosis not present

## 2024-07-09 DIAGNOSIS — J449 Chronic obstructive pulmonary disease, unspecified: Secondary | ICD-10-CM | POA: Diagnosis not present

## 2024-07-09 DIAGNOSIS — I10 Essential (primary) hypertension: Secondary | ICD-10-CM | POA: Diagnosis not present

## 2024-07-09 DIAGNOSIS — Z1389 Encounter for screening for other disorder: Secondary | ICD-10-CM | POA: Diagnosis not present

## 2024-07-09 DIAGNOSIS — E7849 Other hyperlipidemia: Secondary | ICD-10-CM | POA: Diagnosis not present

## 2024-07-09 DIAGNOSIS — F17210 Nicotine dependence, cigarettes, uncomplicated: Secondary | ICD-10-CM | POA: Diagnosis not present

## 2024-07-09 DIAGNOSIS — Z0001 Encounter for general adult medical examination with abnormal findings: Secondary | ICD-10-CM | POA: Diagnosis not present

## 2024-07-09 DIAGNOSIS — I480 Paroxysmal atrial fibrillation: Secondary | ICD-10-CM | POA: Diagnosis not present

## 2024-07-15 DIAGNOSIS — H04123 Dry eye syndrome of bilateral lacrimal glands: Secondary | ICD-10-CM | POA: Diagnosis not present

## 2024-08-09 DIAGNOSIS — I10 Essential (primary) hypertension: Secondary | ICD-10-CM | POA: Diagnosis not present

## 2024-08-09 DIAGNOSIS — I5022 Chronic systolic (congestive) heart failure: Secondary | ICD-10-CM | POA: Diagnosis not present

## 2024-08-10 ENCOUNTER — Ambulatory Visit (INDEPENDENT_AMBULATORY_CARE_PROVIDER_SITE_OTHER): Payer: 59 | Admitting: Gastroenterology

## 2024-08-10 ENCOUNTER — Encounter (INDEPENDENT_AMBULATORY_CARE_PROVIDER_SITE_OTHER): Payer: Self-pay | Admitting: Gastroenterology

## 2024-09-09 DIAGNOSIS — I5022 Chronic systolic (congestive) heart failure: Secondary | ICD-10-CM | POA: Diagnosis not present

## 2024-09-09 DIAGNOSIS — I10 Essential (primary) hypertension: Secondary | ICD-10-CM | POA: Diagnosis not present
